# Patient Record
Sex: Female | Born: 1935 | Race: White | Hispanic: No | Smoking: Never smoker
Health system: Southern US, Community
[De-identification: ages and names within clinical notes are randomized; demographics above are authoritative.]

## PROBLEM LIST (undated history)

## (undated) DIAGNOSIS — C801 Malignant (primary) neoplasm, unspecified: Secondary | ICD-10-CM

## (undated) DIAGNOSIS — E079 Disorder of thyroid, unspecified: Secondary | ICD-10-CM

## (undated) DIAGNOSIS — I10 Essential (primary) hypertension: Secondary | ICD-10-CM

## (undated) DIAGNOSIS — K219 Gastro-esophageal reflux disease without esophagitis: Secondary | ICD-10-CM

## (undated) DIAGNOSIS — E7800 Pure hypercholesterolemia, unspecified: Secondary | ICD-10-CM

## (undated) HISTORY — PX: BACK SURGERY: SHX140

## (undated) HISTORY — PX: CHOLECYSTECTOMY: SHX55

## (undated) HISTORY — PX: NEPHRECTOMY: SHX65

## (undated) HISTORY — PX: CATARACT EXTRACTION: SUR2

## (undated) HISTORY — PX: NASAL SEPTUM SURGERY: SHX37

## (undated) HISTORY — PX: APPENDECTOMY: SHX54

## (undated) HISTORY — PX: HERNIA REPAIR: SHX51

## (undated) HISTORY — PX: TONSILLECTOMY: SUR1361

---

## 2010-04-17 ENCOUNTER — Inpatient Hospital Stay (HOSPITAL_COMMUNITY)
Admission: RE | Admit: 2010-04-17 | Discharge: 2010-04-19 | Disposition: A | Discharge status: Home / Self Care | Payer: Self-pay | Admitting: Neurosurgery

## 2010-10-05 LAB — CBC
HCT: 39.7 % (ref 36.0–46.0)
MCHC: 35 g/dL (ref 30.0–36.0)
MCV: 96.4 fL (ref 78.0–100.0)
Platelets: 300 10*3/uL (ref 150–400)
RDW: 12.5 % (ref 11.5–15.5)
WBC: 14.5 10*3/uL — ABNORMAL HIGH (ref 4.0–10.5)

## 2010-10-05 LAB — BASIC METABOLIC PANEL
BUN: 28 mg/dL — ABNORMAL HIGH (ref 6–23)
Chloride: 99 mEq/L (ref 96–112)
Creatinine, Ser: 0.94 mg/dL (ref 0.4–1.2)
Glucose, Bld: 122 mg/dL — ABNORMAL HIGH (ref 70–99)
Potassium: 4.3 mEq/L (ref 3.5–5.1)

## 2010-10-05 LAB — SURGICAL PCR SCREEN
MRSA, PCR: NEGATIVE
Staphylococcus aureus: NEGATIVE

## 2015-04-06 ENCOUNTER — Emergency Department (HOSPITAL_BASED_OUTPATIENT_CLINIC_OR_DEPARTMENT_OTHER): Payer: Medicare Other

## 2015-04-06 ENCOUNTER — Encounter (HOSPITAL_BASED_OUTPATIENT_CLINIC_OR_DEPARTMENT_OTHER): Payer: Self-pay

## 2015-04-06 ENCOUNTER — Emergency Department (HOSPITAL_BASED_OUTPATIENT_CLINIC_OR_DEPARTMENT_OTHER)
Admission: EM | Admit: 2015-04-06 | Discharge: 2015-04-06 | Disposition: A | Discharge status: Home / Self Care | Payer: Medicare Other | Attending: Emergency Medicine | Admitting: Emergency Medicine

## 2015-04-06 DIAGNOSIS — Y9289 Other specified places as the place of occurrence of the external cause: Secondary | ICD-10-CM | POA: Insufficient documentation

## 2015-04-06 DIAGNOSIS — M25552 Pain in left hip: Secondary | ICD-10-CM

## 2015-04-06 DIAGNOSIS — K219 Gastro-esophageal reflux disease without esophagitis: Secondary | ICD-10-CM | POA: Insufficient documentation

## 2015-04-06 DIAGNOSIS — S3992XA Unspecified injury of lower back, initial encounter: Secondary | ICD-10-CM | POA: Insufficient documentation

## 2015-04-06 DIAGNOSIS — Y9389 Activity, other specified: Secondary | ICD-10-CM | POA: Diagnosis not present

## 2015-04-06 DIAGNOSIS — E079 Disorder of thyroid, unspecified: Secondary | ICD-10-CM | POA: Diagnosis not present

## 2015-04-06 DIAGNOSIS — E780 Pure hypercholesterolemia: Secondary | ICD-10-CM | POA: Insufficient documentation

## 2015-04-06 DIAGNOSIS — Z79899 Other long term (current) drug therapy: Secondary | ICD-10-CM | POA: Diagnosis not present

## 2015-04-06 DIAGNOSIS — Z880 Allergy status to penicillin: Secondary | ICD-10-CM | POA: Diagnosis not present

## 2015-04-06 DIAGNOSIS — I10 Essential (primary) hypertension: Secondary | ICD-10-CM | POA: Diagnosis not present

## 2015-04-06 DIAGNOSIS — X58XXXA Exposure to other specified factors, initial encounter: Secondary | ICD-10-CM | POA: Insufficient documentation

## 2015-04-06 DIAGNOSIS — Y998 Other external cause status: Secondary | ICD-10-CM | POA: Insufficient documentation

## 2015-04-06 DIAGNOSIS — S79912A Unspecified injury of left hip, initial encounter: Secondary | ICD-10-CM | POA: Diagnosis not present

## 2015-04-06 HISTORY — DX: Disorder of thyroid, unspecified: E07.9

## 2015-04-06 HISTORY — DX: Gastro-esophageal reflux disease without esophagitis: K21.9

## 2015-04-06 HISTORY — DX: Pure hypercholesterolemia, unspecified: E78.00

## 2015-04-06 HISTORY — DX: Essential (primary) hypertension: I10

## 2015-04-06 NOTE — ED Provider Notes (Signed)
CSN: [id number]     Arrival date & time 04/06/15  1100 History   First MD Initiated Contact with Patient 04/06/15 1203     Chief Complaint  Patient presents with  . Hip Pain     (Consider location/radiation/quality/duration/timing/severity/associated sxs/prior Treatment) HPI Comments: Patient presents with complaint of left hip pain which started acutely yesterday when she twisted her left leg. Patient states that she was able to hold herself up and did not fall to the ground. She felt a pop when she twisted her leg. She has been able to ambulate but is using a cane that she had after previous back surgery. Typically she does not need a cane to ambulate. She denies new numbness or tingling in her left leg; sometimes she will have some tingling in her foot that she relates to her previous back problems. No treatments prior to arrival except for rest overnight. Patient came in for evaluation after her symptoms were not better today. Patient has some baseline back pain. Patient denies warning symptoms of back pain including: fecal incontinence, urinary retention or overflow incontinence, night sweats, waking from sleep with back pain, unexplained fevers or weight loss, h/o cancer, IVDU, recent trauma.     The history is provided by the patient.    Past Medical History  Diagnosis Date  . Thyroid disease   . Hypertension   . GERD (gastroesophageal reflux disease)   . High cholesterol    Past Surgical History  Procedure Laterality Date  . Back surgery    . Tonsillectomy    . Appendectomy    . Cholecystectomy    . Hernia repair    . Cataract extraction    . Nasal septum surgery     No family history on file. Social History  Substance Use Topics  . Smoking status: Never Smoker   . Smokeless tobacco: None  . Alcohol Use: No   OB History    No data available     Review of Systems  Constitutional: Positive for activity change.  Musculoskeletal: Positive for arthralgias and gait  problem. Negative for back pain, joint swelling and neck pain.  Skin: Negative for wound.  Neurological: Negative for weakness and numbness.      Allergies  Acrylic polymer; Ceclor; Cephalosporins; Metronidazole; Morphine and related; Other; Penicillins; Septra; and Tramadol  Home Medications   Prior to Admission medications   Medication Sig Start Date End Date Taking? Authorizing Provider  beclomethasone (QVAR) 80 MCG/ACT inhaler Inhale into the lungs 2 (two) times daily.   Yes Historical Provider, MD  labetalol (NORMODYNE) 200 MG tablet Take 200 mg by mouth 2 (two) times daily.   Yes Historical Provider, MD  levothyroxine (SYNTHROID, LEVOTHROID) 75 MCG tablet Take 75 mcg by mouth daily before breakfast.   Yes Historical Provider, MD  omeprazole (PRILOSEC) 40 MG capsule Take 40 mg by mouth daily.   Yes Historical Provider, MD  Pitavastatin Calcium (LIVALO) 2 MG TABS Take by mouth.   Yes Historical Provider, MD  ranitidine (ZANTAC) 300 MG capsule Take 300 mg by mouth every evening.   Yes Historical Provider, MD   BP 155/59 mmHg  Pulse 59  Temp(Src) 98.7 F (37.1 C) (Oral)  Resp 18  Ht 5\' 1"  (1.549 m)  Wt 125 lb (56.7 kg)  BMI 23.63 kg/m2  SpO2 99%   Physical Exam  Constitutional: She appears well-developed and well-nourished.  HENT:  Head: Normocephalic and atraumatic.  Eyes: Pupils are equal, round, and reactive to light.  Neck: Normal range of motion. Neck supple.  Cardiovascular: Exam reveals no decreased pulses.   Pulses:      Dorsalis pedis pulses are 2+ on the right side, and 2+ on the left side.       Posterior tibial pulses are 2+ on the right side, and 2+ on the left side.  Musculoskeletal: She exhibits tenderness. She exhibits no edema.       Left hip: She exhibits tenderness and bony tenderness. She exhibits normal range of motion and normal strength.       Left knee: Normal.       Left ankle: Normal.       Thoracic back: She exhibits normal range of motion, no  tenderness and no bony tenderness.       Lumbar back: She exhibits tenderness. She exhibits normal range of motion and no bony tenderness.       Left upper leg: Normal. She exhibits no tenderness, no bony tenderness, no swelling and no edema.       Left lower leg: She exhibits no tenderness, no bony tenderness, no swelling and no edema.       Legs:      Left foot: There is normal range of motion, no tenderness and no bony tenderness.  Neurological: She is alert. No sensory deficit.  Motor, sensation, and vascular distal to the injury is fully intact.   Skin: Skin is warm and dry.  Psychiatric: She has a normal mood and affect.  Nursing note and vitals reviewed.   ED Course  Procedures (including critical care time) Labs Review Labs Reviewed - No data to display  Imaging Review Dg Hip Unilat With Pelvis 2-3 Views Left  04/06/2015   CLINICAL DATA:  Twisted left hip yesterday with lateral pain. Initial encounter.  EXAM: DG HIP (WITH OR WITHOUT PELVIS) 2-3V LEFT  COMPARISON:  None.  FINDINGS: There is no evidence of hip fracture or dislocation.  Mild spurring and narrowing of the left and right hips.  Incidental enthesophyte to the greater trochanter on the left.  Osteopenia and atherosclerosis.  IMPRESSION: No acute finding.   Electronically Signed   By: Monte Fantasia M.D.   On: 04/06/2015 12:51   I have personally reviewed and evaluated these images and lab results as part of my medical decision-making.   EKG Interpretation None      Patient seen and examined. Work-up initiated.    Vital signs reviewed and are as follows: BP 155/59 mmHg  Pulse 59  Temp(Src) 98.7 F (37.1 C) (Oral)  Resp 18  Ht 5\' 1"  (1.549 m)  Wt 125 lb (56.7 kg)  BMI 23.63 kg/m2  SpO2 99%  Patient discussed with Dr. Alvino Chapel.   Imaging negative for fracture. Patient does have some bone spurs. Plan is for right protocol, low-dose NSAIDs/Tylenol (patient does not have a history of heart disease,  uncontrolled hypertension, peptic ulcer disease to preclude short course NSAIDs at low dose). She will continue to use cane for stability the current time. She has orthopedic follow-up as needed. Encourage this if symptoms are not improved in the next 5-7 days with conservative management. Patient encouraged to return with weakness, worsening pain, or other concerns. She verbalizes understanding and agrees with plan   MDM   Final diagnoses:  Hip pain, left   Patient with left hip pain after twisting injury yesterday. Imaging is negative. Likely musculoskeletal pain from twisting injury. She is ambulatory. Lower extremity is neurovascularly intact at her baseline. Do  not suspect acute need for orthopedic intervention today. Conservative measures as above with orthopedic follow-up as needed is reasonable. Patient seems reliable to return with worsening symptoms or other concerns.   Carlisle Cater, PA-C 04/06/15 [place], MD 04/07/15 1539

## 2015-04-06 NOTE — ED Notes (Signed)
Yesterday, pt felt a pop in left hip with a twisting motion-states she has been able to ambulate with a cane-presents to triage in w/c

## 2015-04-06 NOTE — Discharge Instructions (Signed)
Please read and follow all provided instructions.  Your diagnoses today include:  1. Hip pain, left    Tests performed today include:  An x-ray of the affected area - does NOT show any broken bones, shows bone spur on the outside of the hip  Vital signs. See below for your results today.   Medications prescribed:   None  Take any prescribed medications only as directed.  Home care instructions:   Follow any educational materials contained in this packet  Use your cane as needed for stability  Follow R.I.C.E. Protocol:  R - rest your injury   I  - use ice on injury without applying directly to skin  C - compress injury with bandage or splint  E - elevate the injury as much as possible  Follow-up instructions: Please follow-up with your primary care provider or your orthopedic physician (bone specialist) if you continue to have significant pain in 1 week. In this case you may have a more severe injury that requires further care.   Return instructions:   Please return if your toes or feet are numb or tingling, appear gray or blue, or you have severe pain (also elevate the leg and loosen splint or wrap if you were given one)  Please return to the Emergency Department if you experience worsening symptoms.   Please return if you have any other emergent concerns.  Additional Information:  Your vital signs today were: BP 155/59 mmHg   Pulse 59   Temp(Src) 98.7 F (37.1 C) (Oral)   Resp 18   Ht 5\' 1"  (1.549 m)   Wt 125 lb (56.7 kg)   BMI 23.63 kg/m2   SpO2 99% If your blood pressure (BP) was elevated above 135/85 this visit, please have this repeated by your doctor within one month. --------------

## 2015-10-05 DIAGNOSIS — E785 Hyperlipidemia, unspecified: Secondary | ICD-10-CM | POA: Diagnosis present

## 2015-10-05 DIAGNOSIS — K219 Gastro-esophageal reflux disease without esophagitis: Secondary | ICD-10-CM | POA: Insufficient documentation

## 2015-10-05 DIAGNOSIS — E039 Hypothyroidism, unspecified: Secondary | ICD-10-CM | POA: Diagnosis present

## 2015-10-05 DIAGNOSIS — I10 Essential (primary) hypertension: Secondary | ICD-10-CM | POA: Diagnosis present

## 2016-02-14 IMAGING — CR DG HIP (WITH OR WITHOUT PELVIS) 2-3V*L*
3 series · 3 of 3 positions shown · non-contrast
Comparison: None.

CLINICAL DATA: Twisted left hip yesterday with lateral pain.
Initial encounter.

EXAM:
DG HIP (WITH OR WITHOUT PELVIS) 2-3V LEFT

[t pelvis a.p.]
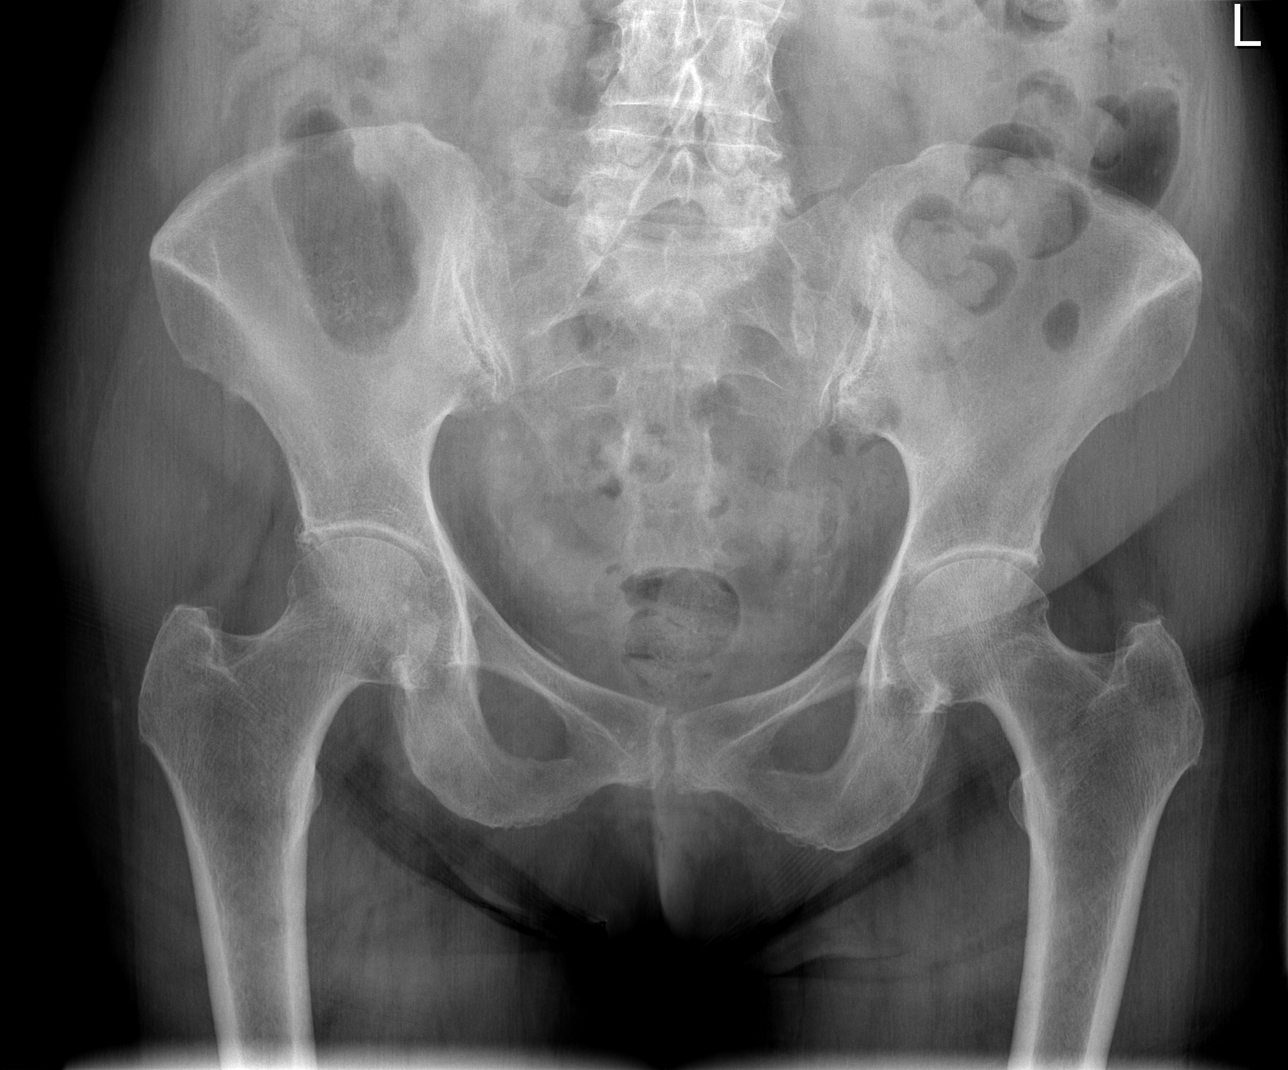

[t hip ap left]
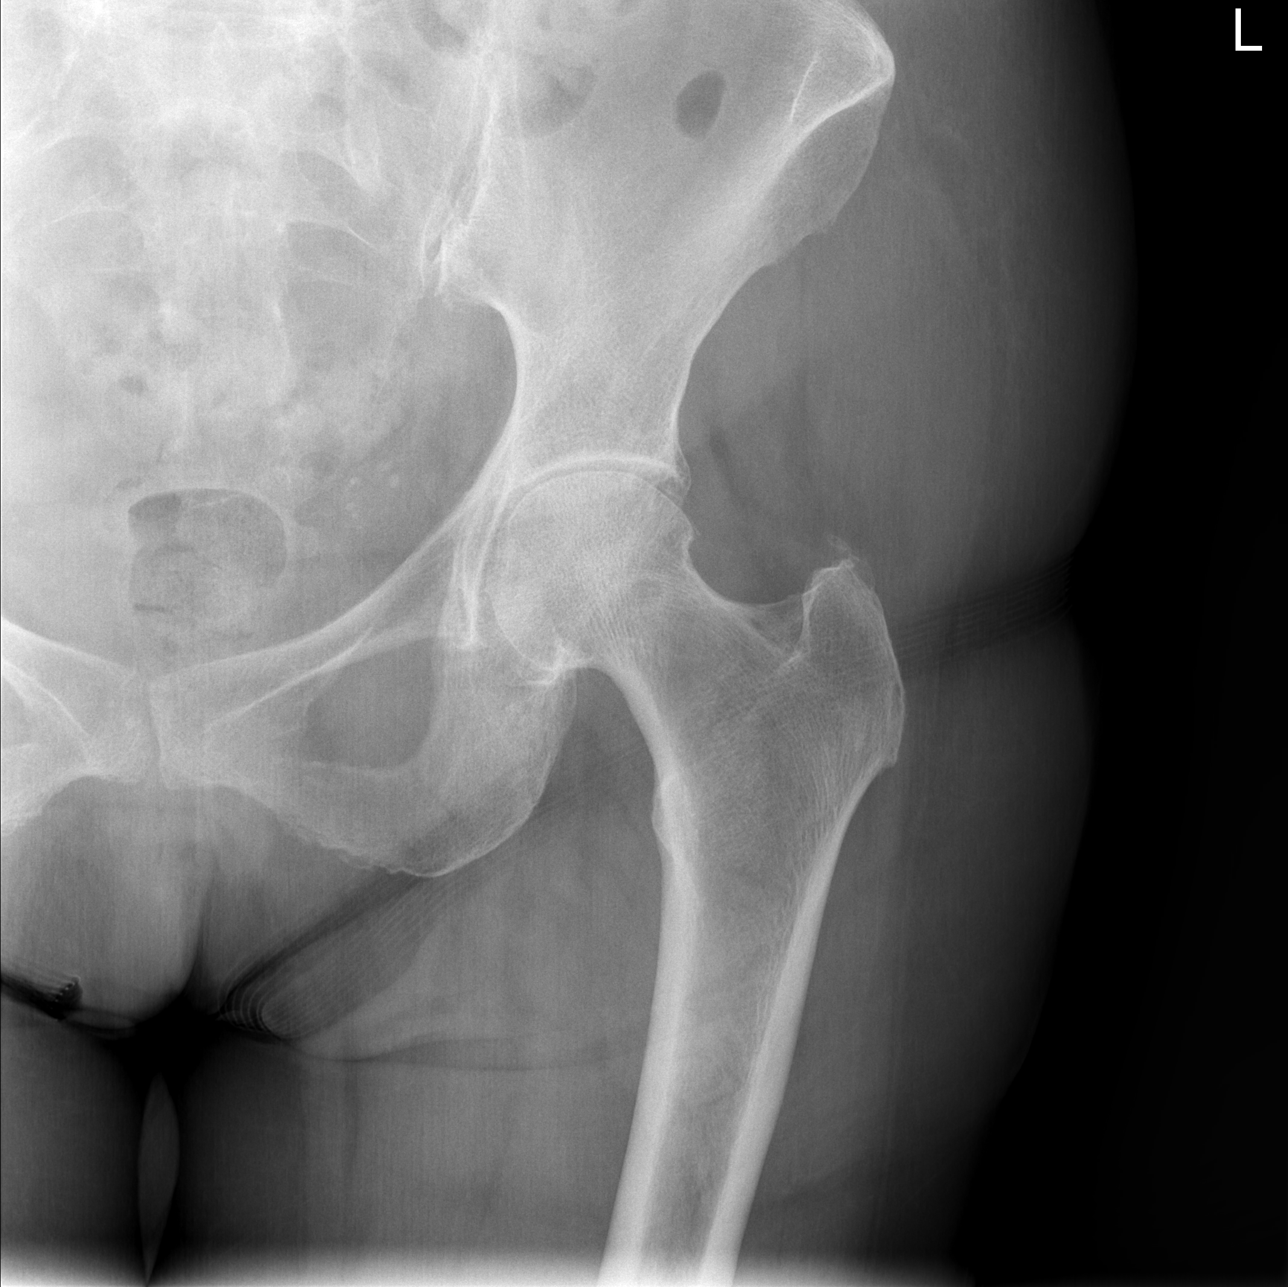

[t hip frog leg left]
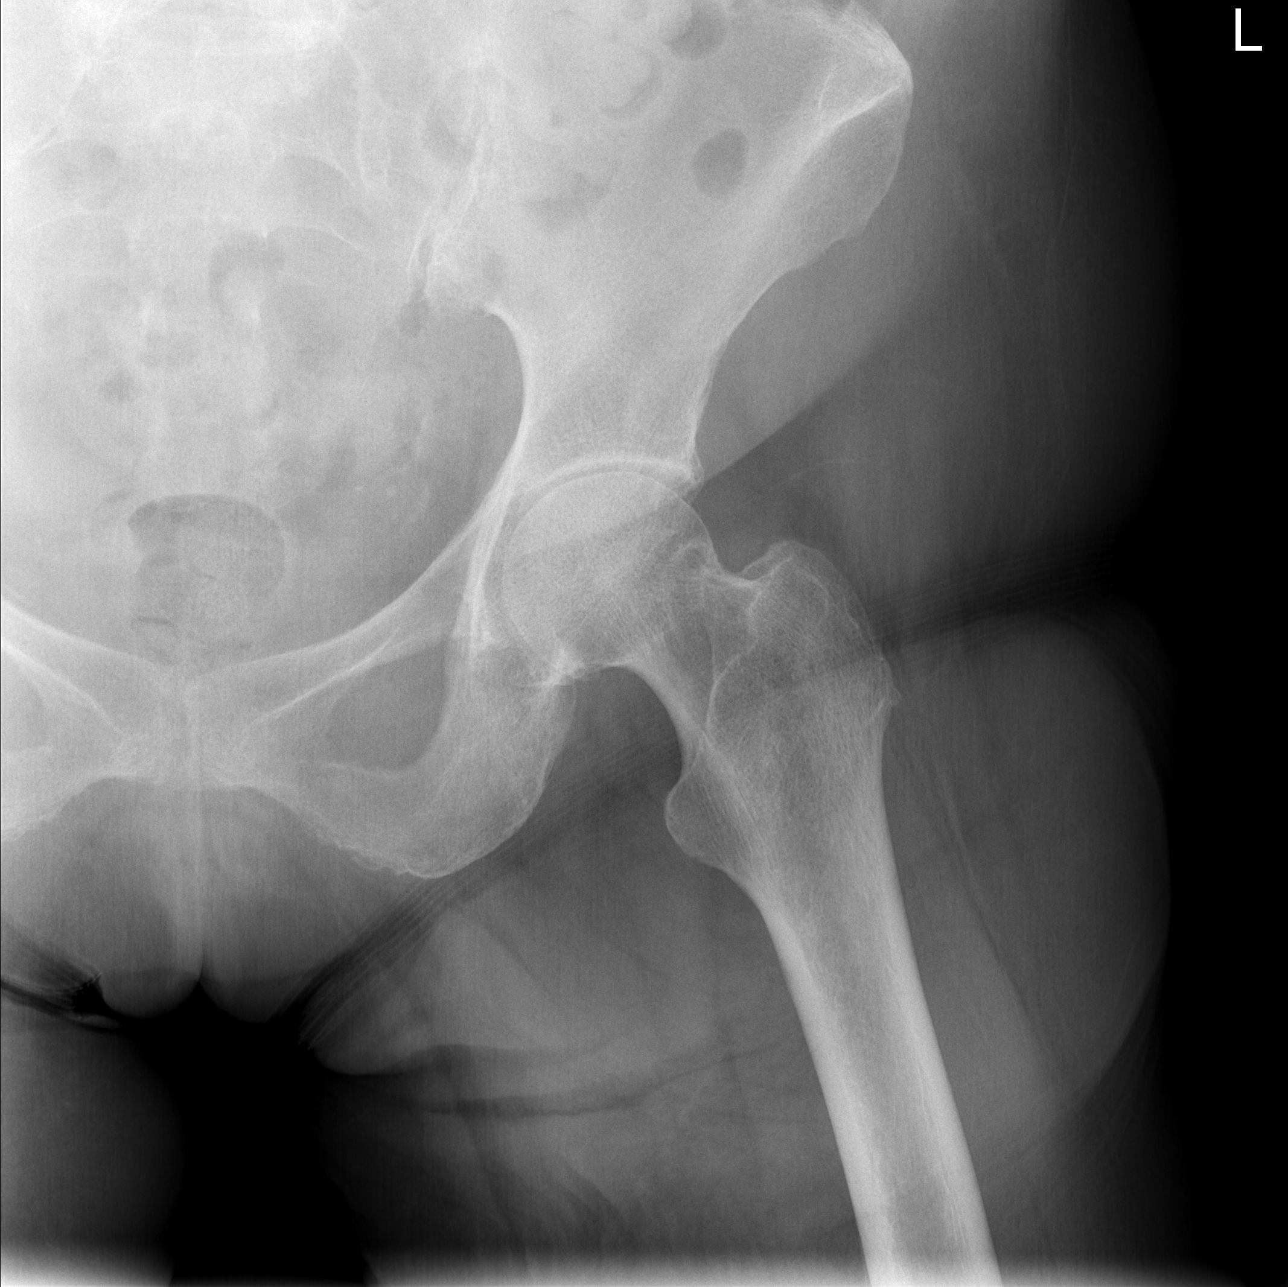

[3 of 3 positions shown; findings below may reference images not displayed]

FINDINGS: There is no evidence of hip fracture or dislocation.

Mild spurring and narrowing of the left and right hips.

Incidental enthesophyte to the greater trochanter on the left.

Osteopenia and atherosclerosis.
IMPRESSION: No acute finding.

## 2020-04-24 ENCOUNTER — Other Ambulatory Visit: Payer: Self-pay

## 2020-04-24 ENCOUNTER — Encounter (HOSPITAL_BASED_OUTPATIENT_CLINIC_OR_DEPARTMENT_OTHER): Payer: Self-pay | Admitting: *Deleted

## 2020-04-24 ENCOUNTER — Emergency Department (HOSPITAL_BASED_OUTPATIENT_CLINIC_OR_DEPARTMENT_OTHER)
Admission: EM | Admit: 2020-04-24 | Discharge: 2020-04-24 | Disposition: A | Discharge status: Home / Self Care | Payer: Medicare Other | Attending: Emergency Medicine | Admitting: Emergency Medicine

## 2020-04-24 DIAGNOSIS — U071 COVID-19: Secondary | ICD-10-CM | POA: Diagnosis not present

## 2020-04-24 DIAGNOSIS — Z85828 Personal history of other malignant neoplasm of skin: Secondary | ICD-10-CM | POA: Diagnosis not present

## 2020-04-24 DIAGNOSIS — R070 Pain in throat: Secondary | ICD-10-CM | POA: Diagnosis present

## 2020-04-24 DIAGNOSIS — I10 Essential (primary) hypertension: Secondary | ICD-10-CM | POA: Insufficient documentation

## 2020-04-24 HISTORY — DX: Malignant (primary) neoplasm, unspecified: C80.1

## 2020-04-24 LAB — RESPIRATORY PANEL BY RT PCR (FLU A&B, COVID)
Influenza A by PCR: NEGATIVE
Influenza B by PCR: NEGATIVE
SARS Coronavirus 2 by RT PCR: POSITIVE — AB

## 2020-04-24 LAB — GROUP A STREP BY PCR: Group A Strep by PCR: NOT DETECTED

## 2020-04-24 MED ORDER — DEXAMETHASONE 6 MG PO TABS
6.0000 mg | ORAL_TABLET | Freq: Once | ORAL | Status: AC
  Administered 2020-04-24: 6 mg via ORAL
  Filled 2020-04-24: qty 1

## 2020-04-24 NOTE — ED Notes (Signed)
States she rec the Covid vaccine in Jan/ Feb of this year

## 2020-04-24 NOTE — ED Notes (Signed)
Placed on cont POX monitoring with int NBP assessments

## 2020-04-24 NOTE — ED Provider Notes (Signed)
[place] EMERGENCY DEPARTMENT Provider Note   CSN: [id number] Arrival date & time: 04/24/20  1812     History Chief Complaint  Patient presents with  . Sore Throat    Gina Stewart is a 86 y.o. female with past medical history significant for hypertension, hyperlipidemia, GERD.  Had Covid vaccinations. Not anticoagulated.  HPI Patient presents to emergency department today with chief complaint of sore throat x3 days.  She is also endorsing generalized weakness.  She is describing her sore throat as an aching pain.  Pain is worse with swallowing.  She did not try any medications for symptoms prior to arrival. She is rating p.o. intake without any difficulty.  She has been checking her temperature twice daily and has been afebrile.  Denies any chills, congestion, shortness of breath, difficulty breathing, chest pain, abdominal pain, nausea, emesis, urinary symptoms, diarrhea, numbness, tingling, weakness.  No sick contacts or known Covid exposures.    Past Medical History:  Diagnosis Date  . Cancer ([place])   . GERD (gastroesophageal reflux disease)   . High cholesterol   . Hypertension   . Thyroid disease     There are no problems to display for this patient.   Past Surgical History:  Procedure Laterality Date  . APPENDECTOMY    . BACK SURGERY    . CATARACT EXTRACTION    . CHOLECYSTECTOMY    . HERNIA REPAIR    . NASAL SEPTUM SURGERY    . NEPHRECTOMY Right   . TONSILLECTOMY       OB History   No obstetric history on file.     No family history on file.  Social History   Tobacco Use  . Smoking status: Never Smoker  . Smokeless tobacco: Never Used  Substance Use Topics  . Alcohol use: No  . Drug use: No    Home Medications Prior to Admission medications   Medication Sig Start Date End Date Taking? Authorizing Provider  beclomethasone (QVAR) 80 MCG/ACT inhaler Inhale into the lungs 2 (two) times daily.    [provider]  labetalol  (NORMODYNE) 200 MG tablet Take 200 mg by mouth 2 (two) times daily.    [provider]  levothyroxine (SYNTHROID, LEVOTHROID) 75 MCG tablet Take 75 mcg by mouth daily before breakfast.    [provider]  omeprazole (PRILOSEC) 40 MG capsule Take 40 mg by mouth daily.    [provider]  Pitavastatin Calcium (LIVALO) 2 MG TABS Take by mouth.    [provider]  ranitidine (ZANTAC) 300 MG capsule Take 300 mg by mouth every evening.    [provider]    Allergies    Acrylic polymer [carbomer], Ceclor [cefaclor], Cephalosporins, Metronidazole, Morphine and related, Other, Penicillins, Septra [sulfamethoxazole-trimethoprim], and Tramadol  Review of Systems   Review of Systems All other systems are reviewed and are negative for acute change except as noted in the HPI.  Physical Exam Updated Vital Signs BP (!) 174/68 (BP Location: Right Arm)   Pulse 66   Temp 98.5 F (36.9 C) (Oral)   Resp 17   Ht 5\' 1"  (1.549 m)   Wt 50.8 kg   SpO2 98%   BMI 21.16 kg/m   Physical Exam Vitals and nursing note reviewed.  Constitutional:      General: She is not in acute distress.    Appearance: She is not ill-appearing.  HENT:     Head: Normocephalic and atraumatic.     Right Ear:  Tympanic membrane and external ear normal.     Left Ear: Tympanic membrane and external ear normal.     Nose: Nose normal.     Mouth/Throat:     Mouth: Mucous membranes are moist. No oral lesions.     Pharynx: Oropharynx is clear. Uvula midline. Posterior oropharyngeal erythema present. No oropharyngeal exudate or uvula swelling.  Eyes:     General: No scleral icterus.       Right eye: No discharge.        Left eye: No discharge.     Extraocular Movements: Extraocular movements intact.     Conjunctiva/sclera: Conjunctivae normal.     Pupils: Pupils are equal, round, and reactive to light.  Neck:     Vascular: No JVD.  Cardiovascular:     Rate and Rhythm: Normal rate and  regular rhythm.     Pulses: Normal pulses.          Radial pulses are 2+ on the right side and 2+ on the left side.     Heart sounds: Normal heart sounds.  Pulmonary:     Comments: Lungs clear to auscultation in all fields. Symmetric chest rise. No wheezing, rales, or rhonchi. Oxygen saturation 98 % on room air during exam. Abdominal:     Comments: Abdomen is soft, non-distended, and non-tender in all quadrants. No rigidity, no guarding. No peritoneal signs.  Musculoskeletal:        General: Normal range of motion.     Cervical back: Normal range of motion.  Skin:    General: Skin is warm and dry.     Capillary Refill: Capillary refill takes less than 2 seconds.  Neurological:     Mental Status: She is oriented to person, place, and time.     GCS: GCS eye subscore is 4. GCS verbal subscore is 5. GCS motor subscore is 6.     Comments: Fluent speech, no facial droop.  Psychiatric:        Behavior: Behavior normal.     ED Results / Procedures / Treatments   Labs (all labs ordered are listed, but only abnormal results are displayed) Labs Reviewed  RESPIRATORY PANEL BY RT PCR (FLU A&B, COVID) - Abnormal; Notable for the following components:      Result Value   SARS Coronavirus 2 by RT PCR POSITIVE (*)    All other components within normal limits  GROUP A STREP BY PCR    EKG None  Radiology No results found.  Procedures Procedures (including critical care time)  Medications Ordered in ED Medications  dexamethasone (DECADRON) tablet 6 mg (6 mg Oral Given 04/24/20 2216)    ED Course  I have reviewed the triage vital signs and the nursing notes.  Pertinent labs & imaging results that were available during my care of the patient were reviewed by me and considered in my medical decision making (see chart for details).    MDM Rules/Calculators/A&P                          History provided by patient with additional history obtained from chart review.    Symptoms and  exam most suggestive of uncomplicated viral illness. DDX incluldes viral URI/LRI, COVID-19.  No travel. No known exposures to confirmed COVID-19.    Patient is very well appearing. Exam is benign.  Normal WOB. No fever, tachypnea, tachycardia, hypoxemia. Lungs are CTAB. I do not think that a CXR is indicated at  this time as VS are WNL, there are no signs of consolidation on auscultation and there is no hypoxia, increased WOB or other concerning features to exam. No significant h/o immunocompromise. Doubt bacterial bronchitis or pneumonia.  Strep and covid tests collected in triage. Covid positive, strep negative. She has no clinical signs of severe illness, dehydration, to warrant further emergent work up in ER. Patient ambulated in the emergency department without respiratory distress or hypoxia, SpO2 >95% on room air.  Given reassuring physical exam, symptoms, will discharge with symptomatic treatment. Given dose of decadron here for sore throat. Recommend telemedicine PCP f/u in the next 2-3 days for persistent symptoms  for further guidance. Self-isolation instructions discussed.  The patient appears reasonably screened and/or stabilized for discharge and I doubt any other medical condition or other [facility] requiring further screening, evaluation, or treatment in the ED at this time prior to discharge. The patient is safe for discharge with strict return precautions discussed. Findings and plan of care discussed with supervising physician Dr. Alvino Chapel.    Gina Stewart was evaluated in Emergency Department on 04/24/2020 for the symptoms described in the history of present illness. She was evaluated in the context of the global COVID-19 pandemic, which necessitated consideration that the patient might be at risk for infection with the SARS-CoV-2 virus that causes COVID-19. Institutional protocols and algorithms that pertain to the evaluation of patients at risk for COVID-19 are in a state of rapid change  based on information released by regulatory bodies including the CDC and federal and state organizations. These policies and algorithms were followed during the patient's care in the ED.   Portions of this note were generated with [employer]. Dictation errors may occur despite best attempts at proofreading.   Final Clinical Impression(s) / ED Diagnoses Final diagnoses:  [id number]    Rx / DC Orders ED Discharge Orders    None       Flint Melter 04/24/20 2310    Davonna Belling, MD 04/24/20 2359

## 2020-04-24 NOTE — ED Triage Notes (Signed)
Pt reports sore throat x 3 days. Report slight cough. She is vaccinated for Covid but has not had booster, second dose was given in February. Reports fatigue. O2 sats 99% in triage

## 2020-04-24 NOTE — Discharge Instructions (Signed)
Thank you for allowing [place] to care for you today.   Please return to the emergency department if you have any new or worsening symptoms.  You tested positive for covid-19 today.   Medications- You can take medications to help treat your symptoms: -Tylenol for fever and body aches. Please take as prescribed on the bottle. -Over the coutner cough medicine such as mucinex, robitussin, or other brands. -Flonase or saline nasal spray for nasal congestion -Vitamins as recommended by CDC  Treatment- This is a virus and unfortunately there are no antibitotics approved to treat this virus at this time. It is important to monitor your symptoms closely: -You should have a theremometer at home to check your temperature when feeling feverish. -Use a pulse ox meter to measure your oxygen when feeling short of breath.  -If your fever is over 100.4 despite taking tylenol or if your oxygen level drops below 94% these are reasons to return to the emergency department for further evaluation. Please call the emergency department before you come to make [place] aware.    We recommend you self-isolate for 10 days. You can end your quarantine if you are fever free on day 10 without taking tylenol.  Again: symptoms of shortness of breath, chest pain, difficulty breathing, new onset of confusion, any symptoms that are concerning. If any of these symptoms you should come to emergency department for evaluation.   I hope you feel better soon

## 2020-04-24 NOTE — ED Notes (Signed)
Dr. Alvino Chapel and triage nurse aware that pt's covid is positive

## 2020-04-24 NOTE — ED Notes (Signed)
Began having throat irritation with non prod cough approx 4 days ago, also had sore throat, not feeling well, poor energy, appetite is poor as well. Strep Test done and is negative but has covid positive results.

## 2020-04-25 ENCOUNTER — Telehealth (HOSPITAL_COMMUNITY): Payer: Self-pay | Admitting: Adult Health

## 2020-04-25 ENCOUNTER — Other Ambulatory Visit (HOSPITAL_COMMUNITY): Payer: Self-pay | Admitting: Adult Health

## 2020-04-25 DIAGNOSIS — U071 COVID-19: Secondary | ICD-10-CM

## 2020-04-25 NOTE — Progress Notes (Signed)
I connected by phone with Gina Stewart on 04/25/2020 at 11:53 AM to discuss the potential use of a new treatment for mild to moderate COVID-19 viral infection in non-hospitalized patients.  This patient is a 86 y.o. female that meets the FDA criteria for Emergency Use Authorization of COVID monoclonal antibody casirivimab/imdevimab or bamlanivimab/eteseviamb.  Has a (+) direct SARS-CoV-2 viral test result  Has mild or moderate COVID-19   Is NOT hospitalized due to COVID-19  Is within 10 days of symptom onset  Has at least one of the high risk factor(s) for progression to severe COVID-19 and/or hospitalization as defined in EUA.  Specific high risk criteria : Older age (>/= 84 yo)   Sx onset 04/21/20   I have spoken and communicated the following to the patient or parent/caregiver regarding COVID monoclonal antibody treatment:  1. FDA has authorized the emergency use for the treatment of mild to moderate COVID-19 in adults and pediatric patients with positive results of direct SARS-CoV-2 viral testing who are 84 years of age and older weighing at least 40 kg, and who are at high risk for progressing to severe COVID-19 and/or hospitalization.  2. The significant known and potential risks and benefits of COVID monoclonal antibody, and the extent to which such potential risks and benefits are unknown.  3. Information on available alternative treatments and the risks and benefits of those alternatives, including clinical trials.  4. Patients treated with COVID monoclonal antibody should continue to self-isolate and use infection control measures (e.g., wear mask, isolate, social distance, avoid sharing personal items, clean and disinfect "high touch" surfaces, and frequent handwashing) according to CDC guidelines.   5. The patient or parent/caregiver has the option to accept or refuse COVID monoclonal antibody treatment.  After reviewing this information with the patient, the patient has  agreed to receive one of the available covid 19 monoclonal antibodies and will be provided an appropriate fact sheet prior to infusion. Scot Dock, NP 04/25/2020 11:53 AM

## 2020-04-25 NOTE — Telephone Encounter (Signed)
error 

## 2020-04-25 NOTE — Telephone Encounter (Signed)
Called and LMOM regarding monoclonal antibody treatment for COVID 19 given to those who are at risk for complications and/or hospitalization of the virus.  Patient meets criteria based on: her age  Call back number given: [phone]  My chart message: unable to send  Wilber Bihari, NP

## 2020-04-26 ENCOUNTER — Ambulatory Visit (HOSPITAL_COMMUNITY)
Admission: RE | Admit: 2020-04-26 | Discharge: 2020-04-26 | Disposition: A | Discharge status: Home / Self Care | Payer: Medicare Other | Source: Ambulatory Visit | Attending: Pulmonary Disease | Admitting: Pulmonary Disease

## 2020-04-26 DIAGNOSIS — Z23 Encounter for immunization: Secondary | ICD-10-CM | POA: Insufficient documentation

## 2020-04-26 DIAGNOSIS — U071 COVID-19: Secondary | ICD-10-CM | POA: Diagnosis present

## 2020-04-26 MED ORDER — SODIUM CHLORIDE 0.9 % IV SOLN: INTRAVENOUS | Status: DC | PRN

## 2020-04-26 MED ORDER — ALBUTEROL SULFATE HFA 108 (90 BASE) MCG/ACT IN AERS: 2.0000 | INHALATION_SPRAY | Freq: Once | RESPIRATORY_TRACT | Status: DC | PRN

## 2020-04-26 MED ORDER — METHYLPREDNISOLONE SODIUM SUCC 125 MG IJ SOLR: 125.0000 mg | Freq: Once | INTRAMUSCULAR | Status: DC | PRN

## 2020-04-26 MED ORDER — DIPHENHYDRAMINE HCL 50 MG/ML IJ SOLN: 50.0000 mg | Freq: Once | INTRAMUSCULAR | Status: DC | PRN

## 2020-04-26 MED ORDER — SODIUM CHLORIDE 0.9 % IV SOLN
1200.0000 mg | Freq: Once | INTRAVENOUS | Status: AC
  Administered 2020-04-26: 1200 mg via INTRAVENOUS

## 2020-04-26 MED ORDER — EPINEPHRINE 0.3 MG/0.3ML IJ SOAJ: 0.3000 mg | Freq: Once | INTRAMUSCULAR | Status: DC | PRN

## 2020-04-26 MED ORDER — FAMOTIDINE IN NACL 20-0.9 MG/50ML-% IV SOLN: 20.0000 mg | Freq: Once | INTRAVENOUS | Status: DC | PRN

## 2020-04-26 NOTE — Progress Notes (Signed)
  Diagnosis: COVID-19  Physician: Dr Joya Gaskins  Procedure: [facility] Infusion [facility] Med: casirivimab\imdevimab infusion - Provided patient with casirivimab\imdevimab fact sheet for patients, parents and caregivers prior to infusion.  Complications: No immediate complications noted.    Discharge: Discharged home   Gina Stewart 04/26/2020

## 2020-04-26 NOTE — Discharge Instructions (Signed)

## 2022-01-27 ENCOUNTER — Other Ambulatory Visit: Payer: Self-pay

## 2022-01-27 ENCOUNTER — Emergency Department (HOSPITAL_BASED_OUTPATIENT_CLINIC_OR_DEPARTMENT_OTHER)
Admission: EM | Admit: 2022-01-27 | Discharge: 2022-01-27 | Disposition: A | Discharge status: Home / Self Care | Payer: Medicare (Managed Care) | Attending: Emergency Medicine | Admitting: Emergency Medicine

## 2022-01-27 ENCOUNTER — Encounter (HOSPITAL_BASED_OUTPATIENT_CLINIC_OR_DEPARTMENT_OTHER): Payer: Self-pay | Admitting: Emergency Medicine

## 2022-01-27 ENCOUNTER — Emergency Department (HOSPITAL_BASED_OUTPATIENT_CLINIC_OR_DEPARTMENT_OTHER): Payer: Medicare (Managed Care)

## 2022-01-27 DIAGNOSIS — Z79899 Other long term (current) drug therapy: Secondary | ICD-10-CM | POA: Diagnosis not present

## 2022-01-27 DIAGNOSIS — U071 COVID-19: Secondary | ICD-10-CM | POA: Insufficient documentation

## 2022-01-27 DIAGNOSIS — I10 Essential (primary) hypertension: Secondary | ICD-10-CM | POA: Diagnosis not present

## 2022-01-27 DIAGNOSIS — R5383 Other fatigue: Secondary | ICD-10-CM | POA: Diagnosis present

## 2022-01-27 LAB — CBC WITH DIFFERENTIAL/PLATELET
Abs Immature Granulocytes: 0.04 10*3/uL (ref 0.00–0.07)
Basophils Absolute: 0 10*3/uL (ref 0.0–0.1)
Basophils Relative: 0 %
Eosinophils Absolute: 0.1 10*3/uL (ref 0.0–0.5)
Eosinophils Relative: 1 %
HCT: 33 % — ABNORMAL LOW (ref 36.0–46.0)
Hemoglobin: 11.4 g/dL — ABNORMAL LOW (ref 12.0–15.0)
Immature Granulocytes: 0 %
Lymphocytes Relative: 8 %
Lymphs Abs: 0.9 10*3/uL (ref 0.7–4.0)
MCH: 33.2 pg (ref 26.0–34.0)
MCHC: 34.5 g/dL (ref 30.0–36.0)
MCV: 96.2 fL (ref 80.0–100.0)
Monocytes Absolute: 1.1 10*3/uL — ABNORMAL HIGH (ref 0.1–1.0)
Monocytes Relative: 11 %
Neutro Abs: 8.1 10*3/uL — ABNORMAL HIGH (ref 1.7–7.7)
Neutrophils Relative %: 80 %
Platelets: 256 10*3/uL (ref 150–400)
RBC: 3.43 MIL/uL — ABNORMAL LOW (ref 3.87–5.11)
RDW: 12.8 % (ref 11.5–15.5)
WBC: 10.2 10*3/uL (ref 4.0–10.5)
nRBC: 0 % (ref 0.0–0.2)

## 2022-01-27 LAB — COMPREHENSIVE METABOLIC PANEL
ALT: 21 U/L (ref 0–44)
AST: 18 U/L (ref 15–41)
Albumin: 3.5 g/dL (ref 3.5–5.0)
Alkaline Phosphatase: 46 U/L (ref 38–126)
Anion gap: 6 (ref 5–15)
BUN: 30 mg/dL — ABNORMAL HIGH (ref 8–23)
CO2: 24 mmol/L (ref 22–32)
Calcium: 8.5 mg/dL — ABNORMAL LOW (ref 8.9–10.3)
Chloride: 105 mmol/L (ref 98–111)
Creatinine, Ser: 0.82 mg/dL (ref 0.44–1.00)
GFR, Estimated: >60 mL/min (ref >60)
Glucose, Bld: 110 mg/dL — ABNORMAL HIGH (ref 70–99)
Potassium: 4 mmol/L (ref 3.5–5.1)
Sodium: 135 mmol/L (ref 135–145)
Total Bilirubin: 0.6 mg/dL (ref 0.3–1.2)
Total Protein: 6.1 g/dL — ABNORMAL LOW (ref 6.5–8.1)

## 2022-01-27 LAB — SARS CORONAVIRUS 2 BY RT PCR: SARS Coronavirus 2 by RT PCR: POSITIVE — AB

## 2022-01-27 MED ORDER — NIRMATRELVIR/RITONAVIR (PAXLOVID)TABLET: 3.0000 | ORAL_TABLET | Freq: Two times a day (BID) | ORAL | 0 refills | Status: AC

## 2022-01-27 MED ORDER — NIRMATRELVIR/RITONAVIR (PAXLOVID)TABLET: 3.0000 | ORAL_TABLET | Freq: Two times a day (BID) | ORAL | 0 refills | Status: DC

## 2022-01-27 MED ORDER — SODIUM CHLORIDE 0.9 % IV BOLUS
1000.0000 mL | Freq: Once | INTRAVENOUS | Status: AC
  Administered 2022-01-27: 1000 mL via INTRAVENOUS

## 2022-01-27 NOTE — ED Provider Notes (Signed)
[facility]ERGENC[facility] DEPARTMENT Provider Note   CSN: [id number] Arrival date & time: 01/27/22  1829     History  Chief Complaint  Patient presents with   Fatigue    Gina Stewart is a 88 y.o. female history of hypertension, recurrent sinusitis, here presenting with fatigue and cough.  Patient was on a cruise to [place] recently.  She just came back yesterday.  She states that it was a long flight and she did not get much sleep.  She woke up this morning with a sore throat and worsening cough.  Also some congestion as well.  Patient states that she just finished a course of clindamycin yesterday.  She states that her ENT doctor prescribed it before her cruise.  Denies any fevers.  The history is provided by the patient.       Home Medications Prior to Admission medications   Medication Sig Start Date End Date Taking? Authorizing Provider  beclomethasone (QVAR) 80 MCG/ACT inhaler Inhale into the lungs 2 (two) times daily.    [provider]  labetalol (NORMODYNE) 200 MG tablet Take 200 mg by mouth 2 (two) times daily.    [provider]  levothyroxine (SYNTHROID, LEVOTHROID) 75 MCG tablet Take 75 mcg by mouth daily before breakfast.    [provider]  nirmatrelvir/ritonavir EUA (PAXLOVID) 20 x 150 MG & 10 x '100MG'$  TABS Take 3 tablets by mouth 2 (two) times daily for 5 days. Patient GFR is > 60. Take nirmatrelvir (150 mg) two tablets twice daily for 5 days and ritonavir (100 mg) one tablet twice daily for 5 days. 01/27/22 02/01/22 Yes Drenda Freeze, MD  omeprazole (PRILOSEC) 40 MG capsule Take 40 mg by mouth daily.    [provider]  Pitavastatin Calcium (LIVALO) 2 MG TABS Take by mouth.    [provider]  ranitidine (ZANTAC) 300 MG capsule Take 300 mg by mouth every evening.    [provider]      Allergies    Acrylic polymer [carbomer], Ceclor [cefaclor], Cephalosporins, Metronidazole, Morphine and related,  Other, Penicillins, Septra [sulfamethoxazole-trimethoprim], and Tramadol    Review of Systems   Review of Systems  HENT:  Positive for congestion and sneezing.   Respiratory:  Positive for cough.   All other systems reviewed and are negative.   Physical Exam Updated Vital Signs BP (!) 148/58   Pulse 67   Temp 98.5 F (36.9 C) (Oral)   Resp 17   Ht 5' (1.524 m)   Wt 49.9 kg   SpO2 98%   BMI 21.48 kg/m  Physical Exam Vitals and nursing note reviewed.  Constitutional:      Comments: Well-appearing for age  HENT:     Head: Normocephalic.     Nose: Nose normal.     Mouth/Throat:     Mouth: Mucous membranes are dry.     Comments: Posterior pharynx slightly dry but not erythematous Eyes:     Extraocular Movements: Extraocular movements intact.     Pupils: Pupils are equal, round, and reactive to light.  Cardiovascular:     Rate and Rhythm: Normal rate and regular rhythm.     Pulses: Normal pulses.     Heart sounds: Normal heart sounds.  Pulmonary:     Effort: Pulmonary effort is normal.     Breath sounds: Normal breath sounds.  Abdominal:     General: Abdomen is flat.     Palpations: Abdomen is soft.  Musculoskeletal:  General: Normal range of motion.     Cervical back: Normal range of motion and neck supple.  Skin:    General: Skin is warm.     Capillary Refill: Capillary refill takes less than 2 seconds.  Neurological:     General: No focal deficit present.     Mental Status: She is oriented to person, place, and time.  Psychiatric:        Mood and Affect: Mood normal.        Behavior: Behavior normal.     ED Results / Procedures / Treatments   Labs (all labs ordered are listed, but only abnormal results are displayed) Labs Reviewed  SARS CORONAVIRUS 2 BY RT PCR - Abnormal; Notable for the following components:      Result Value   SARS Coronavirus 2 by RT PCR POSITIVE (*)    All other components within normal limits  CBC WITH DIFFERENTIAL/PLATELET -  Abnormal; Notable for the following components:   RBC 3.43 (*)    Hemoglobin 11.4 (*)    HCT 33.0 (*)    Neutro Abs 8.1 (*)    Monocytes Absolute 1.1 (*)    All other components within normal limits  COMPREHENSIVE METABOLIC PANEL - Abnormal; Notable for the following components:   Glucose, Bld 110 (*)    BUN 30 (*)    Calcium 8.5 (*)    Total Protein 6.1 (*)    All other components within normal limits    EKG EKG Interpretation  Date/Time:  Saturday January 27 2022 18:53:49 EDT Ventricular Rate:  71 PR Interval:  128 QRS Duration: 70 QT Interval:  382 QTC Calculation: 415 R Axis:   60 Text Interpretation: Normal sinus rhythm Normal ECG No previous ECGs available Confirmed by Wandra Arthurs [id number]) on 01/27/2022 6:57:34 PM  Radiology DG Chest 2 View  Result Date: 01/27/2022 CLINICAL DATA:  Cough.  Skip chest x-ray 01/02/2022 EXAM: CHEST - 2 VIEW COMPARISON:  None Available. FINDINGS: The heart size and mediastinal contours are within normal limits. Both lungs are clear. The visualized skeletal structures are unremarkable. There surgical clips in the upper abdomen. IMPRESSION: No active cardiopulmonary disease. Electronically Signed   By: Ronney Asters M.D.   On: 01/27/2022 20:14    Procedures Procedures    Medications Ordered in ED Medications  sodium chloride 0.9 % bolus 1,000 mL (1,000 mLs Intravenous New Bag/Given 01/27/22 1940)    ED Course/ Medical Decision Making/ A&P                           Medical Decision Making Gina Stewart is a 88 y.o. female here presenting with cough and congestion.  Patient just came back from a cruise.  Concern for possible COVID versus URI.  Also consider pneumonia as well.  Plan to get CBC and CMP and chest x-ray.  Will hydrate patient and reassess  8:17 PM I reviewed patient's labs and imaging study.  Patient's COVID test is positive.  White blood cell count is normal and chest x-ray is clear.  Patient qualifies for Paxlovid and is stable  for discharge.  Problems Addressed: COVID-19: acute illness or injury  Amount and/or Complexity of Data Reviewed Labs: ordered. Decision-making details documented in ED Course. Radiology: ordered and independent interpretation performed. Decision-making details documented in ED Course. ECG/medicine tests: ordered and independent interpretation performed. Decision-making details documented in ED Course.    Final Clinical Impression(s) / ED Diagnoses Final  diagnoses:  COVID-19    Rx / DC Orders ED Discharge Orders          Ordered    nirmatrelvir/ritonavir EUA (PAXLOVID) 20 x 150 MG & 10 x '100MG'$  TABS  2 times daily        01/27/22 2009              Drenda Freeze, MD 01/27/22 2018

## 2022-01-27 NOTE — ED Triage Notes (Signed)
Pt arrives pov, steady gait, c/o cough and  congestion for a "few months", and fatigue today. Reports returning from cruise yesterday. Reports just finished taking abx for sinus infection today. VAN neg. AOx4

## 2022-01-27 NOTE — Discharge Instructions (Addendum)
You have COVID.  Please take Paxlovid as prescribed.  You need to isolate for 5 days and then wear masks around others for another 5 days  See your doctor for follow-up  Return to ER if you have worse cough, fever, trouble breathing

## 2022-03-04 ENCOUNTER — Emergency Department (HOSPITAL_BASED_OUTPATIENT_CLINIC_OR_DEPARTMENT_OTHER)
Admission: EM | Admit: 2022-03-04 | Discharge: 2022-03-04 | Disposition: A | Discharge status: Home / Self Care | Payer: Medicare (Managed Care) | Attending: Emergency Medicine | Admitting: Emergency Medicine

## 2022-03-04 ENCOUNTER — Other Ambulatory Visit: Payer: Self-pay

## 2022-03-04 ENCOUNTER — Encounter (HOSPITAL_BASED_OUTPATIENT_CLINIC_OR_DEPARTMENT_OTHER): Payer: Self-pay | Admitting: Emergency Medicine

## 2022-03-04 DIAGNOSIS — I951 Orthostatic hypotension: Secondary | ICD-10-CM | POA: Diagnosis not present

## 2022-03-04 DIAGNOSIS — I10 Essential (primary) hypertension: Secondary | ICD-10-CM | POA: Diagnosis not present

## 2022-03-04 DIAGNOSIS — Z79899 Other long term (current) drug therapy: Secondary | ICD-10-CM | POA: Insufficient documentation

## 2022-03-04 DIAGNOSIS — R42 Dizziness and giddiness: Secondary | ICD-10-CM | POA: Diagnosis present

## 2022-03-04 LAB — BASIC METABOLIC PANEL
Anion gap: 7 (ref 5–15)
BUN: 20 mg/dL (ref 8–23)
CO2: 27 mmol/L (ref 22–32)
Calcium: 8.7 mg/dL — ABNORMAL LOW (ref 8.9–10.3)
Chloride: 103 mmol/L (ref 98–111)
Creatinine, Ser: 0.81 mg/dL (ref 0.44–1.00)
GFR, Estimated: >60 mL/min (ref >60)
Glucose, Bld: 99 mg/dL (ref 70–99)
Potassium: 4.2 mmol/L (ref 3.5–5.1)
Sodium: 137 mmol/L (ref 135–145)

## 2022-03-04 LAB — CBC WITH DIFFERENTIAL/PLATELET
Abs Immature Granulocytes: 0.15 10*3/uL — ABNORMAL HIGH (ref 0.00–0.07)
Basophils Absolute: 0.1 10*3/uL (ref 0.0–0.1)
Basophils Relative: 1 %
Eosinophils Absolute: 0.1 10*3/uL (ref 0.0–0.5)
Eosinophils Relative: 1 %
HCT: 38.7 % (ref 36.0–46.0)
Hemoglobin: 13 g/dL (ref 12.0–15.0)
Immature Granulocytes: 1 %
Lymphocytes Relative: 18 %
Lymphs Abs: 2 10*3/uL (ref 0.7–4.0)
MCH: 32.7 pg (ref 26.0–34.0)
MCHC: 33.6 g/dL (ref 30.0–36.0)
MCV: 97.5 fL (ref 80.0–100.0)
Monocytes Absolute: 1 10*3/uL (ref 0.1–1.0)
Monocytes Relative: 9 %
Neutro Abs: 7.5 10*3/uL (ref 1.7–7.7)
Neutrophils Relative %: 70 %
Platelets: 319 10*3/uL (ref 150–400)
RBC: 3.97 MIL/uL (ref 3.87–5.11)
RDW: 13.4 % (ref 11.5–15.5)
WBC: 10.7 10*3/uL — ABNORMAL HIGH (ref 4.0–10.5)
nRBC: 0 % (ref 0.0–0.2)

## 2022-03-04 MED ORDER — LACTATED RINGERS IV BOLUS
1000.0000 mL | Freq: Once | INTRAVENOUS | Status: AC
Start: 2022-03-04 — End: 2022-03-04
  Administered 2022-03-04: 1000 mL via INTRAVENOUS

## 2022-03-04 NOTE — ED Notes (Signed)
Pt laid flat at 2:43 for orthostatic vitals

## 2022-03-04 NOTE — Discharge Instructions (Signed)
You were evaluated in the emergency department for dizziness. I suspect your dizziness is due to dehydration. Your sodium levels were normal. Please resume your regular eating habits and drink to satisfy your thirst. Follow up with your primary doctor as soon as possible after you leave the hospital.

## 2022-03-04 NOTE — ED Provider Notes (Signed)
[facility]GENCY DEPARTMENT Provider Note   CSN: [id number] Arrival date & time: 03/04/22  1256     History  Chief Complaint  Patient presents with   Weakness    Gina Stewart is a 88 y.o. female with history of hypertension hyperlipidemia who presents with a chief complaint of dizziness and generalized weakness ongoing for several weeks.  Started around the time she was diagnosed and admitted for COVID infection and hyponatremia.  She does not feel like she is returned to her baseline since being discharged from the hospital.  She describes lightheadedness upon standing, and the feeling as though she is going to faint.  She denies world-spinning sensation.  She reports that she was instructed to restrict fluid intake, but feels like that causes her to become very dehydrated.  She was also instructed to discontinue HCTZ.  She acknowledges that she probably does not eat as much as she should.  Review of systems is negative for chest pain, shortness of breath, cough, abdominal pain, nausea, vomiting, diarrhea.  Medical history: Hypertension Hyperlipidemia Hyponatremia   Weakness      Home Medications Prior to Admission medications   Medication Sig Start Date End Date Taking? Authorizing Provider  beclomethasone (QVAR) 80 MCG/ACT inhaler Inhale into the lungs 2 (two) times daily.    [provider]  labetalol (NORMODYNE) 200 MG tablet Take 200 mg by mouth 2 (two) times daily.    [provider]  levothyroxine (SYNTHROID, LEVOTHROID) 75 MCG tablet Take 75 mcg by mouth daily before breakfast.    [provider]  omeprazole (PRILOSEC) 40 MG capsule Take 40 mg by mouth daily.    [provider]  Pitavastatin Calcium (LIVALO) 2 MG TABS Take by mouth.    [provider]  ranitidine (ZANTAC) 300 MG capsule Take 300 mg by mouth every evening.    [provider]      Allergies    Acrylic polymer [carbomer], Ceclor  [cefaclor], Cephalosporins, Metronidazole, Morphine and related, Other, Penicillins, Septra [sulfamethoxazole-trimethoprim], and Tramadol    Review of Systems   Review of Systems  Neurological:  Positive for weakness.  All other systems reviewed and are negative.   Physical Exam Updated Vital Signs BP (!) 151/91 (BP Location: Left Arm)   Pulse 97   Temp 98.1 F (36.7 C) (Oral)   Resp 16   SpO2 98%  Physical Exam Vitals and nursing note reviewed.  Constitutional:      General: She is not in acute distress.    Appearance: She is well-developed.  HENT:     Head: Normocephalic and atraumatic.  Eyes:     Conjunctiva/sclera: Conjunctivae normal.  Cardiovascular:     Rate and Rhythm: Normal rate and regular rhythm.     Heart sounds: No murmur heard. Pulmonary:     Effort: Pulmonary effort is normal. No respiratory distress.     Breath sounds: Normal breath sounds.  Abdominal:     Palpations: Abdomen is soft.     Tenderness: There is no abdominal tenderness.  Musculoskeletal:        General: No swelling.     Cervical back: Neck supple.  Skin:    General: Skin is warm and dry.     Capillary Refill: Capillary refill takes less than 2 seconds.  Neurological:     General: No focal deficit present.     Mental Status: She is alert.  Psychiatric:        Mood and Affect: Mood  normal.     ED Results / Procedures / Treatments   Labs (all labs ordered are listed, but only abnormal results are displayed) Labs Reviewed  BASIC METABOLIC PANEL - Abnormal; Notable for the following components:      Result Value   Calcium 8.7 (*)    All other components within normal limits  CBC WITH DIFFERENTIAL/PLATELET - Abnormal; Notable for the following components:   WBC 10.7 (*)    Abs Immature Granulocytes 0.15 (*)    All other components within normal limits    EKG EKG Interpretation  Date/Time:  Sunday March 04 2022 14:20:19 EDT Ventricular Rate:  77 PR Interval:  136 QRS  Duration: 85 QT Interval:  392 QTC Calculation: 444 R Axis:   17 Text Interpretation: Sinus rhythm Low voltage, precordial leads Probable anteroseptal infarct, old No significant change since last tracing Confirmed by Blanchie Dessert [phone]) on 03/04/2022 2:28:29 PM  Radiology No results found.  Procedures Procedures    Medications Ordered in ED Medications  lactated ringers bolus 1,000 mL (1,000 mLs Intravenous New Bag/Given 03/04/22 1421)    ED Course/ Medical Decision Making/ A&P                           Medical Decision Making Amount and/or Complexity of Data Reviewed Labs: ordered.   Gina Stewart is a 88 year old female with hypertension and hyperlipidemia who presents with several weeks of generalized weakness and presyncope.  She was recently discharged from the hospital after treatment for COVID-19 and hyponatremia.  She has been struggling with the fluid restriction.  She eats a relatively limited diet.  On exam she is well-appearing and euvolemic.  Differential diagnosis includes presyncope due to hypovolemia and orthostatic hypotension, hyponatremia.  Less concern for cardiac cause like dysrhythmia.  No localizing signs to suggest infection.   Co morbidities that complicate the patient evaluation  Hypertension Hyperlipidemia  Additional history obtained:  Additional history and/or information obtained from friend at bedside External records from outside source obtained and reviewed including charts from prior hospital encounters   Lab Tests:  I Ordered (or co-signed), and personally interpreted labs.  The pertinent results include: CBC, BMP, which were within normal limits.  Cardiac Monitoring:  The patient was maintained on a cardiac monitor.  The cardiac monitored showed an rhythm of NSR. The patient was also maintained on pulse oximetry. The readings were typically within normal limits.   Medicines ordered and prescription drug management:  I  ordered medication including LR 1000 cc IV Reevaluation of the patient after these medicines showed that the patient improved  Reevaluation:  After the interventions noted above, I reevaluated the patient and found that they have :improved.   Dispostion:  After consideration of the diagnostic results and the patients response to treatment, I feel that the patent would benefit from discharge home to resume a regular diet, drink to thirst, and follow up with PCP.          Final Clinical Impression(s) / ED Diagnoses Final diagnoses:  Orthostatic hypotension    Rx / DC Orders ED Discharge Orders     None         Nani Gasser, MD 03/04/22 1524    Blanchie Dessert, MD 03/07/22 1537

## 2022-03-04 NOTE — ED Notes (Signed)
Patient states that she feel dizziness  when she stand  alert x4

## 2022-03-04 NOTE — ED Triage Notes (Signed)
Pt reports she was recently hospitalized for low sodium. Has tried to keep hydrated at home, but feels the same as she did before with lightheadedness and generally not feeling well. No n/v/d.

## 2022-03-18 ENCOUNTER — Emergency Department (HOSPITAL_BASED_OUTPATIENT_CLINIC_OR_DEPARTMENT_OTHER): Payer: Medicare (Managed Care)

## 2022-03-18 ENCOUNTER — Other Ambulatory Visit: Payer: Self-pay

## 2022-03-18 ENCOUNTER — Emergency Department (HOSPITAL_BASED_OUTPATIENT_CLINIC_OR_DEPARTMENT_OTHER)
Admission: EM | Admit: 2022-03-18 | Discharge: 2022-03-18 | Disposition: A | Discharge status: Home / Self Care | Payer: Medicare (Managed Care) | Attending: Emergency Medicine | Admitting: Emergency Medicine

## 2022-03-18 ENCOUNTER — Encounter (HOSPITAL_BASED_OUTPATIENT_CLINIC_OR_DEPARTMENT_OTHER): Payer: Self-pay | Admitting: Emergency Medicine

## 2022-03-18 DIAGNOSIS — R42 Dizziness and giddiness: Secondary | ICD-10-CM | POA: Diagnosis not present

## 2022-03-18 DIAGNOSIS — E860 Dehydration: Secondary | ICD-10-CM | POA: Insufficient documentation

## 2022-03-18 LAB — COMPREHENSIVE METABOLIC PANEL
ALT: 20 U/L (ref 0–44)
AST: 20 U/L (ref 15–41)
Albumin: 3.8 g/dL (ref 3.5–5.0)
Alkaline Phosphatase: 57 U/L (ref 38–126)
Anion gap: 10 (ref 5–15)
BUN: 25 mg/dL — ABNORMAL HIGH (ref 8–23)
CO2: 24 mmol/L (ref 22–32)
Calcium: 8.9 mg/dL (ref 8.9–10.3)
Chloride: 101 mmol/L (ref 98–111)
Creatinine, Ser: 0.93 mg/dL (ref 0.44–1.00)
GFR, Estimated: 58 mL/min — ABNORMAL LOW (ref >60)
Glucose, Bld: 145 mg/dL — ABNORMAL HIGH (ref 70–99)
Potassium: 4.7 mmol/L (ref 3.5–5.1)
Sodium: 135 mmol/L (ref 135–145)
Total Bilirubin: 0.5 mg/dL (ref 0.3–1.2)
Total Protein: 6.8 g/dL (ref 6.5–8.1)

## 2022-03-18 LAB — CBC WITH DIFFERENTIAL/PLATELET
Abs Immature Granulocytes: 0.05 10*3/uL (ref 0.00–0.07)
Basophils Absolute: 0 10*3/uL (ref 0.0–0.1)
Basophils Relative: 1 %
Eosinophils Absolute: 0 10*3/uL (ref 0.0–0.5)
Eosinophils Relative: 0 %
HCT: 38.1 % (ref 36.0–46.0)
Hemoglobin: 13 g/dL (ref 12.0–15.0)
Immature Granulocytes: 1 %
Lymphocytes Relative: 9 %
Lymphs Abs: 0.8 10*3/uL (ref 0.7–4.0)
MCH: 33 pg (ref 26.0–34.0)
MCHC: 34.1 g/dL (ref 30.0–36.0)
MCV: 96.7 fL (ref 80.0–100.0)
Monocytes Absolute: 0.4 10*3/uL (ref 0.1–1.0)
Monocytes Relative: 6 %
Neutro Abs: 6.7 10*3/uL (ref 1.7–7.7)
Neutrophils Relative %: 83 %
Platelets: 332 10*3/uL (ref 150–400)
RBC: 3.94 MIL/uL (ref 3.87–5.11)
RDW: 13.2 % (ref 11.5–15.5)
WBC: 8 10*3/uL (ref 4.0–10.5)
nRBC: 0 % (ref 0.0–0.2)

## 2022-03-18 MED ORDER — MECLIZINE HCL 25 MG PO TABS
25.0000 mg | ORAL_TABLET | Freq: Once | ORAL | Status: AC
Start: 2022-03-18 — End: 2022-03-18
  Administered 2022-03-18: 25 mg via ORAL
  Filled 2022-03-18: qty 1

## 2022-03-18 MED ORDER — SODIUM CHLORIDE 0.9 % IV BOLUS
1000.0000 mL | Freq: Once | INTRAVENOUS | Status: AC
  Administered 2022-03-18: 1000 mL via INTRAVENOUS

## 2022-03-18 NOTE — ED Provider Notes (Signed)
[facility] DEPARTMENT Provider Note   CSN: [id number] Arrival date & time: 03/18/22  1235     History  Chief Complaint  Patient presents with   Dizziness    Gina Stewart is a 88 y.o. female.  34 y oF with a chief complaints of feeling dizzy.  She has had trouble with this off and on over the past couple months or so.  She has been seen in the ED multiple times for this.  She was told that her sodium was low and she thinks this feels consistent with the same.  Describes it as a rotary like movement.  Seems to be worse when she tries to stand up and walk.  She feels like she has been eating and drinking normally.  She recently had seen an ENT for chronic sinus congestion and was started on new therapies.  She was doing well yesterday and then woke up this morning and felt worse.  She denies trauma to the head of the neck.   Dizziness      Home Medications Prior to Admission medications   Medication Sig Start Date End Date Taking? Authorizing Provider  beclomethasone (QVAR) 80 MCG/ACT inhaler Inhale into the lungs 2 (two) times daily.    [provider]  labetalol (NORMODYNE) 200 MG tablet Take 200 mg by mouth 2 (two) times daily.    [provider]  levothyroxine (SYNTHROID, LEVOTHROID) 75 MCG tablet Take 75 mcg by mouth daily before breakfast.    [provider]  omeprazole (PRILOSEC) 40 MG capsule Take 40 mg by mouth daily.    [provider]  Pitavastatin Calcium (LIVALO) 2 MG TABS Take by mouth.    [provider]  ranitidine (ZANTAC) 300 MG capsule Take 300 mg by mouth every evening.    [provider]      Allergies    Acrylic polymer [carbomer], Ceclor [cefaclor], Cephalosporins, Metronidazole, Morphine and related, Other, Penicillins, Septra [sulfamethoxazole-trimethoprim], and Tramadol    Review of Systems   Review of Systems  Neurological:  Positive for dizziness.    Physical  Exam Updated Vital Signs BP (!) 184/81   Pulse 88   Temp 97.9 F (36.6 C) (Oral)   Resp 19   Wt 48.5 kg   SpO2 99%   BMI 20.90 kg/m  Physical Exam Vitals and nursing note reviewed.  Constitutional:      General: She is not in acute distress.    Appearance: She is well-developed. She is not diaphoretic.  HENT:     Head: Normocephalic and atraumatic.  Eyes:     Pupils: Pupils are equal, round, and reactive to light.  Cardiovascular:     Rate and Rhythm: Normal rate and regular rhythm.     Heart sounds: No murmur heard.    No friction rub. No gallop.  Pulmonary:     Effort: Pulmonary effort is normal.     Breath sounds: No wheezing or rales.  Abdominal:     General: There is no distension.     Palpations: Abdomen is soft.     Tenderness: There is no abdominal tenderness.  Musculoskeletal:        General: No tenderness.     Cervical back: Normal range of motion and neck supple.  Skin:    General: Skin is warm and dry.  Neurological:     Mental Status: She is alert and oriented to person, place, and time.  Psychiatric:  Behavior: Behavior normal.     ED Results / Procedures / Treatments   Labs (all labs ordered are listed, but only abnormal results are displayed) Labs Reviewed  COMPREHENSIVE METABOLIC PANEL - Abnormal; Notable for the following components:      Result Value   Glucose, Bld 145 (*)    BUN 25 (*)    GFR, Estimated 58 (*)    All other components within normal limits  CBC WITH DIFFERENTIAL/PLATELET    EKG EKG Interpretation  Date/Time:  Sunday March 18 2022 12:54:00 EDT Ventricular Rate:  83 PR Interval:  131 QRS Duration: 73 QT Interval:  356 QTC Calculation: 419 R Axis:   41 Text Interpretation: Sinus rhythm No significant change since last tracing Confirmed by Gina Stewart [phone]) on 03/18/2022 12:57:01 PM  Radiology CT Head Wo Contrast  Result Date: 03/18/2022 CLINICAL DATA:  Neuro deficit, acute.  Dizziness. EXAM: CT HEAD WITHOUT  CONTRAST TECHNIQUE: Contiguous axial images were obtained from the base of the skull through the vertex without intravenous contrast. RADIATION DOSE REDUCTION: This exam was performed according to the departmental dose-optimization program which includes automated exposure control, adjustment of the mA and/or kV according to patient size and/or use of iterative reconstruction technique. COMPARISON:  Head CT 02/05/2020 FINDINGS: Brain: Scattered low-density in the white matter is suggestive for chronic changes. No evidence for acute hemorrhage, mass lesion, midline shift, hydrocephalus or large infarct. Vascular: No hyperdense vessel or unexpected calcification. Skull: Normal. Negative for fracture or focal lesion. Sinuses/Orbits: Mucosal thickening and opacification in the ethmoid air cells. Other: None. IMPRESSION: 1. No acute intracranial abnormality. 2. Low-density in the white matter is suggestive for chronic small vessel ischemic changes. 3. Ethmoid sinus disease. Electronically Signed   By: Markus Daft M.D.   On: 03/18/2022 13:35   DG Chest Port 1 View  Result Date: 03/18/2022 CLINICAL DATA:  Cough. EXAM: PORTABLE CHEST 1 VIEW COMPARISON:  Chest radiograph 02/10/2022 FINDINGS: Both lungs are clear. No airspace disease or pulmonary edema. Heart and mediastinum are within normal limits. Atherosclerotic calcifications at the aortic arch. Negative for a pneumothorax. No acute bone abnormality. Scattered calcifications most likely associated with costal calcifications. IMPRESSION: No active disease. Electronically Signed   By: Markus Daft M.D.   On: 03/18/2022 13:30    Procedures Procedures    Medications Ordered in ED Medications  sodium chloride 0.9 % bolus 1,000 mL (1,000 mLs Intravenous New Bag/Given 03/18/22 1356)  meclizine (ANTIVERT) tablet 25 mg (25 mg Oral Given 03/18/22 1353)    ED Course/ Medical Decision Making/ A&P                           Medical Decision Making Amount and/or  Complexity of Data Reviewed Labs: ordered. Radiology: ordered.   88 yo F with a chief complaints of dizziness.  This has been going on for months.  Seems to come and go.  She was told to prior evaluation for this that her sodium was low and has had chronic sinusitis.  She recently saw ENT for this and was started on some new medications.  We will obtain a laboratory evaluation bolus of IV fluids CT of the head.  Patient is coughing pretty consistently in the room we will obtain a chest x-ray.  Chest x-ray independently turbid by me without focal infiltrate.  CT of the head without obvious intracranial pathology.  No anemia no significant electrolyte abnormality.  Patient is feeling a bit  better after IV fluids.  Question dehydration.  We will have her follow-up with her family doctor in the office.  2:59 PM:  I have discussed the diagnosis/risks/treatment options with the patient and family.  Evaluation and diagnostic testing in the emergency department does not suggest an emergent condition requiring admission or immediate intervention beyond what has been performed at this time.  They will follow up with  PCP. We also discussed returning to the ED immediately if new or worsening sx occur. We discussed the sx which are most concerning (e.g., sudden worsening pain, fever, inability to tolerate by mouth) that necessitate immediate return. Medications administered to the patient during their visit and any new prescriptions provided to the patient are listed below.  Medications given during this visit Medications  sodium chloride 0.9 % bolus 1,000 mL (1,000 mLs Intravenous New Bag/Given 03/18/22 1356)  meclizine (ANTIVERT) tablet 25 mg (25 mg Oral Given 03/18/22 1353)     The patient appears reasonably screen and/or stabilized for discharge and I doubt any other medical condition or other [facility] requiring further screening, evaluation, or treatment in the ED at this time prior to discharge.           Final Clinical Impression(s) / ED Diagnoses Final diagnoses:  Dehydration    Rx / DC Orders ED Discharge Orders     None         Gina Etienne, DO 03/18/22 1459

## 2022-03-18 NOTE — ED Notes (Signed)
Urine send to lab

## 2022-03-18 NOTE — ED Notes (Signed)
D/c paperwork reviewed with pt.  All questions addressed prior to d/c. Pt ambulatory without assistance to ED exit, NAD.

## 2022-03-18 NOTE — ED Triage Notes (Signed)
Pt arrives pov, to triage in wheelchair, c/o dizziness today that started at 0630. Last normal last night 2130. Speech clear, recent admit for low Na+.. Pt denies HA. AOx 4, reports LUE distal tingling that has resolved

## 2022-03-18 NOTE — Discharge Instructions (Signed)
Eat and drink as well as you can for the next 48 hours.  Please follow-up with your family doctor in the office. 

## 2022-03-18 NOTE — ED Notes (Signed)
Patient transported to CT 

## 2022-03-22 ENCOUNTER — Emergency Department (HOSPITAL_BASED_OUTPATIENT_CLINIC_OR_DEPARTMENT_OTHER)
Admission: EM | Admit: 2022-03-22 | Discharge: 2022-03-22 | Discharge status: Against Medical Advice | Payer: Medicare (Managed Care)

## 2022-03-25 ENCOUNTER — Observation Stay (HOSPITAL_BASED_OUTPATIENT_CLINIC_OR_DEPARTMENT_OTHER)
Admission: EM | Admit: 2022-03-25 | Discharge: 2022-03-26 | Disposition: A | Discharge status: Home / Self Care | Payer: Medicare (Managed Care) | Attending: Internal Medicine | Admitting: Internal Medicine

## 2022-03-25 ENCOUNTER — Encounter (HOSPITAL_COMMUNITY): Payer: Self-pay

## 2022-03-25 ENCOUNTER — Other Ambulatory Visit: Payer: Self-pay

## 2022-03-25 ENCOUNTER — Emergency Department (HOSPITAL_BASED_OUTPATIENT_CLINIC_OR_DEPARTMENT_OTHER): Payer: Medicare (Managed Care)

## 2022-03-25 ENCOUNTER — Encounter (HOSPITAL_BASED_OUTPATIENT_CLINIC_OR_DEPARTMENT_OTHER): Payer: Self-pay

## 2022-03-25 DIAGNOSIS — R202 Paresthesia of skin: Secondary | ICD-10-CM

## 2022-03-25 DIAGNOSIS — N1831 Chronic kidney disease, stage 3a: Secondary | ICD-10-CM | POA: Insufficient documentation

## 2022-03-25 DIAGNOSIS — R200 Anesthesia of skin: Principal | ICD-10-CM

## 2022-03-25 DIAGNOSIS — Z859 Personal history of malignant neoplasm, unspecified: Secondary | ICD-10-CM | POA: Diagnosis not present

## 2022-03-25 DIAGNOSIS — E039 Hypothyroidism, unspecified: Secondary | ICD-10-CM | POA: Diagnosis not present

## 2022-03-25 DIAGNOSIS — Z79899 Other long term (current) drug therapy: Secondary | ICD-10-CM | POA: Insufficient documentation

## 2022-03-25 DIAGNOSIS — G459 Transient cerebral ischemic attack, unspecified: Secondary | ICD-10-CM

## 2022-03-25 DIAGNOSIS — Z7902 Long term (current) use of antithrombotics/antiplatelets: Secondary | ICD-10-CM | POA: Insufficient documentation

## 2022-03-25 DIAGNOSIS — I129 Hypertensive chronic kidney disease with stage 1 through stage 4 chronic kidney disease, or unspecified chronic kidney disease: Secondary | ICD-10-CM | POA: Insufficient documentation

## 2022-03-25 DIAGNOSIS — I10 Essential (primary) hypertension: Secondary | ICD-10-CM | POA: Diagnosis present

## 2022-03-25 DIAGNOSIS — E785 Hyperlipidemia, unspecified: Secondary | ICD-10-CM

## 2022-03-25 DIAGNOSIS — Z7982 Long term (current) use of aspirin: Secondary | ICD-10-CM | POA: Insufficient documentation

## 2022-03-25 LAB — CBC WITH DIFFERENTIAL/PLATELET
Abs Immature Granulocytes: 0.1 10*3/uL — ABNORMAL HIGH (ref 0.00–0.07)
Basophils Absolute: 0.1 10*3/uL (ref 0.0–0.1)
Basophils Relative: 1 %
Eosinophils Absolute: 0.3 10*3/uL (ref 0.0–0.5)
Eosinophils Relative: 2 %
HCT: 39.8 % (ref 36.0–46.0)
Hemoglobin: 13.7 g/dL (ref 12.0–15.0)
Immature Granulocytes: 1 %
Lymphocytes Relative: 17 %
Lymphs Abs: 2.1 10*3/uL (ref 0.7–4.0)
MCH: 33 pg (ref 26.0–34.0)
MCHC: 34.4 g/dL (ref 30.0–36.0)
MCV: 95.9 fL (ref 80.0–100.0)
Monocytes Absolute: 1.3 10*3/uL — ABNORMAL HIGH (ref 0.1–1.0)
Monocytes Relative: 11 %
Neutro Abs: 8.5 10*3/uL — ABNORMAL HIGH (ref 1.7–7.7)
Neutrophils Relative %: 68 %
Platelets: 365 10*3/uL (ref 150–400)
RBC: 4.15 MIL/uL (ref 3.87–5.11)
RDW: 13 % (ref 11.5–15.5)
WBC: 12.3 10*3/uL — ABNORMAL HIGH (ref 4.0–10.5)
nRBC: 0 % (ref 0.0–0.2)

## 2022-03-25 LAB — COMPREHENSIVE METABOLIC PANEL WITH GFR
ALT: 20 U/L (ref 0–44)
AST: 20 U/L (ref 15–41)
Albumin: 4 g/dL (ref 3.5–5.0)
Alkaline Phosphatase: 58 U/L (ref 38–126)
Anion gap: 9 (ref 5–15)
BUN: 23 mg/dL (ref 8–23)
CO2: 28 mmol/L (ref 22–32)
Calcium: 8.9 mg/dL (ref 8.9–10.3)
Chloride: 101 mmol/L (ref 98–111)
Creatinine, Ser: 0.89 mg/dL (ref 0.44–1.00)
GFR, Estimated: >60 mL/min
Glucose, Bld: 107 mg/dL — ABNORMAL HIGH (ref 70–99)
Potassium: 4 mmol/L (ref 3.5–5.1)
Sodium: 138 mmol/L (ref 135–145)
Total Bilirubin: 0.8 mg/dL (ref 0.3–1.2)
Total Protein: 6.7 g/dL (ref 6.5–8.1)

## 2022-03-25 LAB — TROPONIN I (HIGH SENSITIVITY)
Troponin I (High Sensitivity): 9 ng/L
Troponin I (High Sensitivity): 8 ng/L (ref <18)

## 2022-03-25 LAB — MAGNESIUM: Magnesium: 2.2 mg/dL (ref 1.7–2.4)

## 2022-03-25 MED ORDER — CLOPIDOGREL BISULFATE 75 MG PO TABS
75.0000 mg | ORAL_TABLET | Freq: Every day | ORAL | Status: DC
  Administered 2022-03-25 – 2022-03-26 (×2): 75 mg via ORAL
  Filled 2022-03-25 (×2): qty 1

## 2022-03-25 MED ORDER — ACETAMINOPHEN 160 MG/5ML PO SOLN: 650.0000 mg | ORAL | Status: DC | PRN

## 2022-03-25 MED ORDER — LORAZEPAM 0.5 MG PO TABS
0.5000 mg | ORAL_TABLET | Freq: Once | ORAL | Status: AC | PRN
  Administered 2022-03-26: 0.5 mg via ORAL
  Filled 2022-03-25: qty 1

## 2022-03-25 MED ORDER — ACETAMINOPHEN 650 MG RE SUPP: 650.0000 mg | RECTAL | Status: DC | PRN

## 2022-03-25 MED ORDER — ASPIRIN 81 MG PO CHEW
81.0000 mg | CHEWABLE_TABLET | Freq: Every day | ORAL | Status: DC
Start: 2022-03-25 — End: 2022-03-26
  Administered 2022-03-25 – 2022-03-26 (×2): 81 mg via ORAL
  Filled 2022-03-25 (×2): qty 1

## 2022-03-25 MED ORDER — LEVOTHYROXINE SODIUM 75 MCG PO TABS
75.0000 ug | ORAL_TABLET | Freq: Every day | ORAL | Status: DC
  Administered 2022-03-26: 75 ug via ORAL
  Filled 2022-03-25: qty 1

## 2022-03-25 MED ORDER — ONDANSETRON HCL 4 MG/2ML IJ SOLN
4.0000 mg | Freq: Four times a day (QID) | INTRAMUSCULAR | Status: DC | PRN
Start: 2022-03-25 — End: 2022-03-26

## 2022-03-25 MED ORDER — ENOXAPARIN SODIUM 30 MG/0.3ML IJ SOSY
30.0000 mg | PREFILLED_SYRINGE | INTRAMUSCULAR | Status: DC
  Administered 2022-03-25: 30 mg via SUBCUTANEOUS
  Filled 2022-03-25: qty 0.3

## 2022-03-25 MED ORDER — ACETAMINOPHEN 325 MG PO TABS: 650.0000 mg | ORAL_TABLET | ORAL | Status: DC | PRN

## 2022-03-25 MED ORDER — IOHEXOL 350 MG/ML SOLN
75.0000 mL | Freq: Once | INTRAVENOUS | Status: AC | PRN
  Administered 2022-03-25: 75 mL via INTRAVENOUS

## 2022-03-25 MED ORDER — SENNOSIDES-DOCUSATE SODIUM 8.6-50 MG PO TABS: 1.0000 | ORAL_TABLET | Freq: Every evening | ORAL | Status: DC | PRN

## 2022-03-25 MED ORDER — STROKE: EARLY STAGES OF RECOVERY BOOK
Freq: Once | Status: AC
Start: 2022-03-26 — End: 2022-03-26
  Filled 2022-03-25: qty 1

## 2022-03-25 MED ORDER — EZETIMIBE 10 MG PO TABS
10.0000 mg | ORAL_TABLET | Freq: Every day | ORAL | Status: DC
  Administered 2022-03-26: 10 mg via ORAL
  Filled 2022-03-25: qty 1

## 2022-03-25 NOTE — ED Notes (Signed)
Pt awaiting transfer, no needs or complaints at this time.

## 2022-03-25 NOTE — Progress Notes (Signed)
Plan of Care Note for accepted transfer   Patient: Gina Stewart MRN: [id number]   DOA: 03/25/2022  Facility requesting transfer: [facility] Requesting Provider: Dr. Laverta Baltimore  Reason for transfer: TIA r/o  Facility course: 88 year old with hx of GERD, HTN, HLD, hypothyroid, CKD stage 3a who presented to ED with complaints of dizziness. She has hx of vertigo, but woke up at 12:30 and didn't feel good. Felt lightheaded and just stayed in bed.  This AM she got up and felt like her walking was off. She then felt numbness in her left face that lasted less than an hour and resolved. She has no other focal deficits/weakness. Neurologically intact, tingling/numbness intact.   Vitals: stable, bp: 167/74 Labs: wbc: 12.3,  CT head: no acute finding CTA head/neck: pending In ED: neurology consulted, given ASA/plavix. Recommended TIA w/u and HTN control. Let neurology know when she gets here.   Plan of care: The patient is accepted for admission to Telemetry unit, at [facility]..    Author: Orma Flaming, MD 03/25/2022  Check [email or web address] for on-call coverage.  Nursing staff, Please call [place] number on Amion as soon as patient's arrival, so appropriate admitting provider can evaluate the pt.

## 2022-03-25 NOTE — ED Notes (Signed)
ED TO INPATIENT HANDOFF REPORT  ED Nurse Name and Phone #: Baxter Flattery, RN  S Name/Age/Gender Gina Stewart 88 y.o. female Room/Bed: MH06/MH06  Code Status   Code Status: Not on file  Home/SNF/Other Home Patient oriented to: self, place, time, and situation Is this baseline? Yes   Triage Complete: Triage complete  Chief Complaint Transient ischemic attack (TIA) [G45.9]  Triage Note Pt c/o dizziness that has been going on for some time. States she feels like she is going "to faint". Also having tingling on her left face and numbness and in left hand that started around 10am. It has gone away currently.   Allergies Allergies  Allergen Reactions   Acrylic Polymer [Carbomer]    Ceclor [Cefaclor]    Cephalosporins    Metronidazole    Morphine And Related    Other     Rubber   Penicillins    Septra [Sulfamethoxazole-Trimethoprim]    Tramadol     Level of Care/Admitting Diagnosis ED Disposition     ED Disposition  Admit   Condition  --   Comment  Hospital Area: [place] [[id number]]  Level of Care: Telemetry Medical [104]  Interfacility transfer: Yes  May place patient in observation at [facility] or [place] if equivalent level of care is available:: No  Covid Evaluation: Asymptomatic - no recent exposure (last 10 days) testing not required  Diagnosis: Transient ischemic attack (TIA) [706237]  Admitting Physician: Orma Flaming [[id number]]  Attending Physician: Orma Flaming [[id number]]          B Medical/Surgery History Past Medical History:  Diagnosis Date   Cancer ([place])    GERD (gastroesophageal reflux disease)    High cholesterol    Hypertension    Thyroid disease    Past Surgical History:  Procedure Laterality Date   APPENDECTOMY     BACK SURGERY     CATARACT EXTRACTION     CHOLECYSTECTOMY     HERNIA REPAIR     NASAL SEPTUM SURGERY     NEPHRECTOMY Right    TONSILLECTOMY       A IV Location/Drains/Wounds Patient  Lines/Drains/Airways Status     Active Line/Drains/Airways     Name Placement date Placement time Site Days   Peripheral IV 03/25/22 20 G Left Antecubital 03/25/22  1259  Antecubital  less than 1            Intake/Output Last 24 hours No intake or output data in the 24 hours ending 03/25/22 1701  Labs/Imaging Results for orders placed or performed during the hospital encounter of 03/25/22 (from the past 48 hour(s))  Comprehensive metabolic panel     Status: Abnormal   Collection Time: 03/25/22  1:00 PM  Result Value Ref Range   Sodium 138 135 - 145 mmol/L   Potassium 4.0 3.5 - 5.1 mmol/L   Chloride 101 98 - 111 mmol/L   CO2 28 22 - 32 mmol/L   Glucose, Bld 107 (H) 70 - 99 mg/dL    Comment: Glucose reference range applies only to samples taken after fasting for at least 8 hours.   BUN 23 8 - 23 mg/dL   Creatinine, Ser 0.89 0.44 - 1.00 mg/dL   Calcium 8.9 8.9 - 10.3 mg/dL   Total Protein 6.7 6.5 - 8.1 g/dL   Albumin 4.0 3.5 - 5.0 g/dL   AST 20 15 - 41 U/L   ALT 20 0 - 44 U/L   Alkaline Phosphatase 58 38 - 126 U/L  Total Bilirubin 0.8 0.3 - 1.2 mg/dL   GFR, Estimated >60 >60 mL/min    Comment: (NOTE) Calculated using the CKD-EPI Creatinine Equation (2021)    Anion gap 9 5 - 15    Comment: Performed at [facility], [address] [place], [place] [address]  CBC with Differential     Status: Abnormal   Collection Time: 03/25/22  1:00 PM  Result Value Ref Range   WBC 12.3 (H) 4.0 - 10.5 K/uL   RBC 4.15 3.87 - 5.11 MIL/uL   Hemoglobin 13.7 12.0 - 15.0 g/dL   HCT 39.8 36.0 - 46.0 %   MCV 95.9 80.0 - 100.0 fL   MCH 33.0 26.0 - 34.0 pg   MCHC 34.4 30.0 - 36.0 g/dL   RDW 13.0 11.5 - 15.5 %   Platelets 365 150 - 400 K/uL   nRBC 0.0 0.0 - 0.2 %   Neutrophils Relative % 68 %   Neutro Abs 8.5 (H) 1.7 - 7.7 K/uL   Lymphocytes Relative 17 %   Lymphs Abs 2.1 0.7 - 4.0 K/uL   Monocytes Relative 11 %   Monocytes Absolute 1.3 (H) 0.1 - 1.0 K/uL   Eosinophils  Relative 2 %   Eosinophils Absolute 0.3 0.0 - 0.5 K/uL   Basophils Relative 1 %   Basophils Absolute 0.1 0.0 - 0.1 K/uL   Immature Granulocytes 1 %   Abs Immature Granulocytes 0.10 (H) 0.00 - 0.07 K/uL    Comment: Performed at [facility], [address] [place], [place] [address]  Troponin I (High Sensitivity)     Status: None   Collection Time: 03/25/22  1:00 PM  Result Value Ref Range   Troponin I (High Sensitivity) 9 <18 ng/L    Comment: (NOTE) Elevated high sensitivity troponin I (hsTnI) values and significant  changes across serial measurements may suggest ACS but many other  chronic and acute conditions are known to elevate hsTnI results.  Refer to the "Links" section for chest pain algorithms and additional  guidance. Performed at [facility], [address] [place], [place] [address]   Magnesium     Status: None   Collection Time: 03/25/22  1:00 PM  Result Value Ref Range   Magnesium 2.2 1.7 - 2.4 mg/dL    Comment: Performed at [facility], [address] [place], [place] [address]  Troponin I (High Sensitivity)     Status: None   Collection Time: 03/25/22  3:24 PM  Result Value Ref Range   Troponin I (High Sensitivity) 8 <18 ng/L    Comment: (NOTE) Elevated high sensitivity troponin I (hsTnI) values and significant  changes across serial measurements may suggest ACS but many other  chronic and acute conditions are known to elevate hsTnI results.  Refer to the "Links" section for chest pain algorithms and additional  guidance. Performed at [facility], [address] [place], [place] [address]    CT [facility] HEAD NECK W WO CM  Result Date: 03/25/2022 CLINICAL DATA:  Stroke, follow up. Dizziness. Numbness/tingling involving the left face and left hand which has resolved. EXAM: CT ANGIOGRAPHY HEAD AND NECK TECHNIQUE: Multidetector CT imaging of the head and neck was performed using the standard protocol during bolus  administration of intravenous contrast. Multiplanar CT image reconstructions and MIPs were obtained to evaluate the vascular anatomy. Carotid stenosis measurements (when applicable) are obtained utilizing NASCET criteria, using the distal internal  carotid diameter as the denominator. RADIATION DOSE REDUCTION: This exam was performed according to the departmental dose-optimization program which includes automated exposure control, adjustment of the mA and/or kV according to patient size and/or use of iterative reconstruction technique. CONTRAST:  46m OMNIPAQUE IOHEXOL 350 MG/ML SOLN COMPARISON:  None Available. FINDINGS: CTA NECK FINDINGS Aortic arch: Standard 3 vessel aortic arch with a moderate amount of calcified and soft plaque. Patent brachiocephalic and subclavian arteries with heavily calcified plaque at the left subclavian origin resulting in 60% stenosis. Right carotid system: Patent with a small amount of soft plaque at the carotid bifurcation and a small amount of calcified plaque in the proximal ICA. No evidence of an associated significant stenosis or dissection. Prominent beading of the mid and distal cervical ICA. Left carotid system: Patent with scattered calcified and soft plaque throughout the common carotid artery not resulting in significant stenosis. Soft plaque in the proximal ICA results in 65% stenosis. Prominent beading of the mid and distal cervical ICA. Vertebral arteries: Patent and codominant with calcified plaque at the vertebral origins resulting in mild stenosis bilaterally. Skeleton: Cervical disc degeneration, most severe at C5-6. Moderately advanced cervical facet arthrosis with multilevel degenerative grade 1 anterolisthesis. Other neck: No evidence of cervical lymphadenopathy or mass. Upper chest: No apical lung consolidation or mass. Review of the MIP images confirms the above findings CTA HEAD FINDINGS Anterior circulation: The internal carotid arteries are patent from skull  base to carotid termini with mild-to-moderate calcified atherosclerosis bilaterally not resulting in a significant stenosis. ACAs and MCAs are patent without evidence of a proximal branch occlusion or significant proximal stenosis. No aneurysm is identified. Posterior circulation: The intracranial vertebral arteries are widely patent to the basilar. Patent PICA and SCA origins are seen bilaterally. The basilar artery is patent with mild stenosis in its midportion. Posterior communicating arteries are diminutive or absent. The PCAs are patent with mild atherosclerotic irregularity but no flow limiting proximal stenosis. No aneurysm is identified. Venous sinuses: Patent. Anatomic variants: None. Review of the MIP images confirms the above findings IMPRESSION: 1. No large vessel occlusion. 2. 65% proximal left ICA stenosis. 3. Prominent beading of the cervical internal carotid arteries consistent with fibromuscular dysplasia. 4. 60% stenosis of the proximal left subclavian artery. 5. Mild stenosis of both vertebral artery origins. 6. Aortic Atherosclerosis (ICD10-I70.0). Electronically Signed   By: ALogan BoresM.D.   On: 03/25/2022 14:59   CT Head Wo Contrast  Result Date: 03/25/2022 CLINICAL DATA:  Dizziness.  Sensation that she is going to faint. EXAM: CT HEAD WITHOUT CONTRAST TECHNIQUE: Contiguous axial images were obtained from the base of the skull through the vertex without intravenous contrast. RADIATION DOSE REDUCTION: This exam was performed according to the departmental dose-optimization program which includes automated exposure control, adjustment of the mA and/or kV according to patient size and/or use of iterative reconstruction technique. COMPARISON:  03/18/2022 FINDINGS: Brain: No evidence of acute infarction, hemorrhage, hydrocephalus, extra-axial collection or mass lesion/mass effect. Mild ventricular sulcal enlargement reflecting age-appropriate volume loss. Patchy white matter hypoattenuation  noted bilaterally consistent with mild to moderate chronic microvascular ischemic change. Vascular: No hyperdense vessel or unexpected calcification. Skull: Normal. Negative for fracture or focal lesion. Sinuses/Orbits: Globes and orbits are unremarkable. Sinuses are clear. Other: None. IMPRESSION: 1. No acute intracranial abnormalities. Electronically Signed   By: DLajean ManesM.D.   On: 03/25/2022 13:40    Pending Labs Unresulted Labs (From admission, onward)    None  Vitals/Pain Today's Vitals   03/25/22 1345 03/25/22 1430 03/25/22 1445 03/25/22 1545  BP: (!) 155/98 (!) 174/64 (!) 169/68 (!) 153/65  Pulse: 79 73 78 74  Resp:  '14 11 14  '$ Temp:      TempSrc:      SpO2: 99% 99% 99% 97%  Weight:      Height:      PainSc:        Isolation Precautions No active isolations  Medications Medications  aspirin chewable tablet 81 mg (81 mg Oral Given 03/25/22 1402)  clopidogrel (PLAVIX) tablet 75 mg (75 mg Oral Given 03/25/22 1402)  iohexol (OMNIPAQUE) 350 MG/ML injection 75 mL (75 mLs Intravenous Contrast Given 03/25/22 1409)    Mobility walks with device Low fall risk   Focused Assessments Neuro Assessment Handoff:  Swallow screen pass? Yes    NIH Stroke Scale ( + Modified Stroke Scale Criteria)  Interval: Initial Level of Consciousness (1a.)   : Alert, keenly responsive LOC Questions (1b. )   +: Answers both questions correctly LOC Commands (1c. )   + : Performs both tasks correctly Best Gaze (2. )  +: Normal Visual (3. )  +: No visual loss Facial Palsy (4. )    : Normal symmetrical movements Motor Arm, Left (5a. )   +: No drift Motor Arm, Right (5b. )   +: No drift Motor Leg, Left (6a. )   +: No drift Motor Leg, Right (6b. )   +: No drift Limb Ataxia (7. ): Absent Sensory (8. )   +: (S) Mild-to-moderate sensory loss, patient feels pinprick is less sharp or is dull on the affected side, or there is a loss of superficial pain with pinprick, but patient is aware of  being touched (left arm felt less) Best Language (9. )   +: No aphasia Dysarthria (10. ): Normal Extinction/Inattention (11.)   +: No Abnormality Modified SS Total  +: 1 Complete NIHSS TOTAL: 1     Neuro Assessment: Exceptions to WDL Neuro Checks:   Initial (03/25/22 1300)  Last Documented NIHSS Modified Score: 1 (03/25/22 1300) Has TPA been given? No If patient is a Neuro Trauma and patient is going to OR before floor call report to [place] nurse: [phone] or [phone]   R Recommendations: See Admitting Provider Note  Report given to:   Additional Notes:

## 2022-03-25 NOTE — Assessment & Plan Note (Signed)
Presenting with transient numbness and tingling of the lower left side of face, now resolved.  Neurology recommending admission for CVA work-up. -CT head negative -CTA head/neck negative for emergent LVO, 75% proximal left ICA stenosis and 60% proximal left subclavian artery stenosis noted -MRI brain pending -Started on aspirin 81 mg and Plavix 75 mg daily -Obtain echocardiogram -Check A1c and lipid panel -PT/OT/SLP eval -Keep on telemetry, continue neurochecks -Allow permissive hypertension for now

## 2022-03-25 NOTE — H&P (Signed)
History and Physical    Gina Stewart:[id number] DOB: 1930/02/02 DOA: 03/25/2022  PCP: Derrill Center., MD  Patient coming from: Home  I have personally briefly reviewed patient's old medical records in [place]  Chief Complaint: Left lower facial numbness and tingling  HPI: Gina Stewart is a 88 y.o. female with medical history significant for HTN, HLD, CKD stage IIIa, hypothyroidism who presented to the ED for evaluation of transient left lower facial numbness and tingling.  Patient reports new onset of left lower facial numbness and tingling being around 10 AM on 03/25/2022.  This lasted about 30 minutes.  She did not have any associated facial droop, dysarthria, involvement of her extremities, focal weakness, confusion, nausea, vomiting, bowel pain, chest pain, palpitations, dysuria. Patient also reports chronic issues with intermittent dizziness.  [place] ED Course  Labs/Imaging on admission: I have personally reviewed following labs and imaging studies.  Initial vitals showed BP 167/74, pulse 81, RR 18, temp 98.1 F, SPO2 98% on room air.  Labs showed WBC 12.3, hemoglobin 30.7, platelets 365,000, sodium 138, potassium 4.0, bicarb 28, BUN 23, creatinine 0.89, serum glucose 107, LFTs within normal limits, troponin 9 > 8, magnesium 2.2.  CT head without contrast negative for acute intracranial normalities.  CTA head/neck negative for large vessel occlusion.  65% proximal left ICA stenosis and 60% stenosis of the proximal left subclavian artery noted.  Prominent beading of the cervical internal carotid arteries consistent with fibromuscular dysplasia and mild stenosis of both vertebral artery origins noted.  EDP discussed with on-call neurology who recommended medical admission for further CVA work-up.  Patient was started on aspirin 81 mg and Plavix 75 mg daily.  The hospitalist service was consulted to admit for further evaluation and management.  Review  of Systems: All systems reviewed and are negative except as documented in history of present illness above.   Past Medical History:  Diagnosis Date   Cancer ([place])    GERD (gastroesophageal reflux disease)    High cholesterol    Hypertension    Thyroid disease     Past Surgical History:  Procedure Laterality Date   APPENDECTOMY     BACK SURGERY     CATARACT EXTRACTION     CHOLECYSTECTOMY     HERNIA REPAIR     NASAL SEPTUM SURGERY     NEPHRECTOMY Right    TONSILLECTOMY      Social History:  reports that she has never smoked. She has never used smokeless tobacco. She reports that she does not drink alcohol and does not use drugs.  Allergies  Allergen Reactions   Acrylic Polymer [Carbomer]    Ceclor [Cefaclor]    Cephalosporins    Metronidazole    Morphine And Related    Other     Rubber   Penicillins    Septra [Sulfamethoxazole-Trimethoprim]    Tramadol     History reviewed. No pertinent family history.   Prior to Admission medications   Medication Sig Start Date End Date Taking? Authorizing Provider  beclomethasone (QVAR) 80 MCG/ACT inhaler Inhale into the lungs 2 (two) times daily.    [provider]  labetalol (NORMODYNE) 200 MG tablet Take 200 mg by mouth 2 (two) times daily.    [provider]  levothyroxine (SYNTHROID, LEVOTHROID) 75 MCG tablet Take 75 mcg by mouth daily before breakfast.    [provider]  omeprazole (PRILOSEC) 40 MG capsule Take 40 mg by mouth daily.  [provider]  Pitavastatin Calcium (LIVALO) 2 MG TABS Take by mouth.    [provider]  ranitidine (ZANTAC) 300 MG capsule Take 300 mg by mouth every evening.    [provider]    Physical Exam: Vitals:   03/25/22 1700 03/25/22 1702 03/25/22 1830 03/25/22 2043  BP: (!) 157/81  (!) 142/57 (!) 158/67  Pulse: 93  74 77  Resp:   15 14  Temp:  98.1 F (36.7 C)    TempSrc:  Oral  Oral  SpO2: 100%  100% 98%  Weight:      Height:        Constitutional: Resting in bed, NAD, calm, comfortable Eyes: EOMI, lids and conjunctivae normal ENMT: Mucous membranes are moist. Posterior pharynx clear of any exudate or lesions.Normal dentition.  Neck: normal, supple, no masses. Respiratory: clear to auscultation bilaterally, no wheezing, no crackles. Normal respiratory effort. No accessory muscle use.  Cardiovascular: Regular rate and rhythm, no murmurs / rubs / gallops. No extremity edema. 2+ pedal pulses. Abdomen: no tenderness, no masses palpated. No hepatosplenomegaly. Bowel sounds positive.  Musculoskeletal: no clubbing / cyanosis. No joint deformity upper and lower extremities. Good ROM, no contractures. Normal muscle tone.  Skin: no rashes, lesions, ulcers. No induration Neurologic: CN 2-12 grossly intact. Sensation intact. Strength 5/5 in all 4.  Psychiatric: Normal judgment and insight. Alert and oriented x 3. Normal mood.   EKG: Personally reviewed. Sinus rhythm, no acute ischemic changes.  Assessment/Plan Principal Problem:   Numbness and tingling of left side of face Active Problems:   Benign essential hypertension   Acquired hypothyroidism   Hyperlipidemia LDL goal <70   Gina Stewart is a 88 y.o. female with medical history significant for HTN, HLD, CKD stage IIIa, hypothyroidism who presented with transient left lower facial numbness and tingling and admitted for CVA work-up.  Assessment and Plan: * Numbness and tingling of left side of face Presenting with transient numbness and tingling of the lower left side of face, now resolved.  Neurology recommending admission for CVA work-up. -CT head negative -CTA head/neck negative for emergent LVO, 75% proximal left ICA stenosis and 60% proximal left subclavian artery stenosis noted -MRI brain pending -Started on aspirin 81 mg and Plavix 75 mg daily -Obtain echocardiogram -Check A1c and lipid panel -PT/OT/SLP eval -Keep on telemetry, continue  neurochecks -Allow permissive hypertension for now  Benign essential hypertension Holding antihypertensives to allow permissive hypertension for now.  Hyperlipidemia LDL goal <70 Continue Zetia.  Not currently on statin, would add high intensity atorvastatin or rosuvastatin if no history of intolerance.  Acquired hypothyroidism Continue Synthroid.  DVT prophylaxis: enoxaparin (LOVENOX) injection 30 mg Start: 03/25/22 2215 Code Status: Full code, confirmed on admission Family Communication: Discussed with patient, she has discussed with close acquaintances Disposition Plan: From home and likely discharge to home pending clinical progress Consults called: Neurology Severity of Illness: The appropriate patient status for this patient is OBSERVATION. Observation status is judged to be reasonable and necessary in order to provide the required intensity of service to ensure the patient's safety. The patient's presenting symptoms, physical exam findings, and initial radiographic and laboratory data in the context of their medical condition is felt to place them at decreased risk for further clinical deterioration. Furthermore, it is anticipated that the patient will be medically stable for discharge from the hospital within 2 midnights of admission.   Zada Finders MD [facility]  If 7PM-7AM, please contact night-coverage [email or web address]  03/25/2022, 10:33  PM  

## 2022-03-25 NOTE — ED Provider Notes (Signed)
Emergency Department Provider Note   I have reviewed the triage vital signs and the nursing notes.   HISTORY  Chief Complaint Dizziness   HPI Gina Stewart is a 88 y.o. female with past history reviewed below presents emergency department with lightheadedness starting around midnight.  Patient states that she has a prior history of vertigo but this was not her experience last night.  She woke up not feeling well and felt somewhat lightheaded.  She denied any chest discomfort or heart palpitations.  No shortness of breath.  No fevers or chills.  She was able to get back to sleep but today has continued to feel lightheaded especially with standing and walking.  She was able to make it to breakfast with her friend and noticed some "tingling and numbness" feeling to the left face and jaw. She notes some associated tingling in the left pinky finger but has had this issue in the past. No leg symptoms.  She tells me that she feels like her gait when she walks is "off" but attributes that mainly to feeling lightheaded.  She states the numbness has resolved but with this constellation of symptoms came in for evaluation.  Of note, she states that she has had issues this summer with abnormal potassium and sodium levels.  She saw her PCP on Wednesday and was found to have elevated potassium and was restarted on her Lokelma.  Has been taking that again since Wednesday with no issues.   Past Medical History:  Diagnosis Date   Cancer ([place])    GERD (gastroesophageal reflux disease)    High cholesterol    Hypertension    Thyroid disease     Review of Systems  Constitutional: No fever/chills. Patient with lightheadedness.  Eyes: No visual changes. ENT: No sore throat. Cardiovascular: Denies chest pain. Respiratory: Denies shortness of breath. Gastrointestinal: No abdominal pain.  No nausea, no vomiting.  No diarrhea.  No constipation. Genitourinary: Negative for dysuria. Musculoskeletal:  Negative for back pain. Skin: Negative for rash. Neurological: Negative for headaches, focal weakness. ? Gait instability and tingling to the left face (resolved).   ____________________________________________   PHYSICAL EXAM:  VITAL SIGNS: ED Triage Vitals  Enc Vitals Group     BP 03/25/22 1300 (!) 167/74     Pulse Rate 03/25/22 1300 81     Resp 03/25/22 1300 18     Temp 03/25/22 1300 98.1 F (36.7 C)     Temp Source 03/25/22 1300 Oral     SpO2 03/25/22 1300 98 %     Weight 03/25/22 1257 108 lb (49 kg)     Height 03/25/22 1257 5' (1.524 m)   Constitutional: Alert and oriented. Well appearing and in no acute distress. Eyes: Conjunctivae are normal.  Head: Atraumatic. Nose: No congestion/rhinnorhea. Mouth/Throat: Mucous membranes are moist.  Neck: No stridor.   Cardiovascular: Normal rate, regular rhythm. Good peripheral circulation. Grossly normal heart sounds.   Respiratory: Normal respiratory effort.  No retractions. Lungs CTAB. Gastrointestinal: Soft and nontender. No distention.  Musculoskeletal: No lower extremity tenderness nor edema. No gross deformities of extremities. Neurologic:  Normal speech and language. No gross focal neurologic deficits are appreciated. Normal sensation to the face, arms, and legs. No pronator drift. Normal finger to nose.  Skin:  Skin is warm, dry and intact. No rash noted.  ____________________________________________   LABS (all labs ordered are listed, but only abnormal results are displayed)  Labs Reviewed  COMPREHENSIVE METABOLIC PANEL - Abnormal; Notable for the  following components:      Result Value   Glucose, Bld 107 (*)    All other components within normal limits  CBC WITH DIFFERENTIAL/PLATELET - Abnormal; Notable for the following components:   WBC 12.3 (*)    Neutro Abs 8.5 (*)    Monocytes Absolute 1.3 (*)    Abs Immature Granulocytes 0.10 (*)    All other components within normal limits  MAGNESIUM  TROPONIN I (HIGH  SENSITIVITY)  TROPONIN I (HIGH SENSITIVITY)   ____________________________________________  EKG   EKG Interpretation  Date/Time:  Sunday March 25 2022 13:02:46 EDT Ventricular Rate:  79 PR Interval:  110 QRS Duration: 78 QT Interval:  375 QTC Calculation: 430 R Axis:   59 Text Interpretation: Sinus rhythm Borderline short PR interval Nonspecific T abnormalities, lateral leads Confirmed by Nanda Quinton [phone]) on 03/25/2022 1:18:54 PM        ____________________________________________  RADIOLOGY  CT Head Wo Contrast  Result Date: 03/25/2022 CLINICAL DATA:  Dizziness.  Sensation that she is going to faint. EXAM: CT HEAD WITHOUT CONTRAST TECHNIQUE: Contiguous axial images were obtained from the base of the skull through the vertex without intravenous contrast. RADIATION DOSE REDUCTION: This exam was performed according to the departmental dose-optimization program which includes automated exposure control, adjustment of the mA and/or kV according to patient size and/or use of iterative reconstruction technique. COMPARISON:  03/18/2022 FINDINGS: Brain: No evidence of acute infarction, hemorrhage, hydrocephalus, extra-axial collection or mass lesion/mass effect. Mild ventricular sulcal enlargement reflecting age-appropriate volume loss. Patchy white matter hypoattenuation noted bilaterally consistent with mild to moderate chronic microvascular ischemic change. Vascular: No hyperdense vessel or unexpected calcification. Skull: Normal. Negative for fracture or focal lesion. Sinuses/Orbits: Globes and orbits are unremarkable. Sinuses are clear. Other: None. IMPRESSION: 1. No acute intracranial abnormalities. Electronically Signed   By: Lajean Manes M.D.   On: 03/25/2022 13:40    ____________________________________________   PROCEDURES  Procedure(s) performed:   Procedures  None  ____________________________________________   INITIAL IMPRESSION / ASSESSMENT AND PLAN / ED  COURSE  Pertinent labs & imaging results that were available during my care of the patient were reviewed by me and considered in my medical decision making (see chart for details).   This patient is Presenting for Evaluation of tingling, which does require a range of treatment options, and is a complaint that involves a high risk of morbidity and mortality.  The Differential Diagnoses include dehydration, electrolyte disturbance, TIA, complex migraine, cervical radiculopathy, ACS, etc.  Critical Interventions-    Medications  aspirin chewable tablet 81 mg (81 mg Oral Given 03/25/22 1402)  clopidogrel (PLAVIX) tablet 75 mg (75 mg Oral Given 03/25/22 1402)  iohexol (OMNIPAQUE) 350 MG/ML injection 75 mL (75 mLs Intravenous Contrast Given 03/25/22 1409)    Reassessment after intervention: patient remains without symptoms.    I did obtain Additional Historical Information from friend at bedside.   I decided to review pertinent External Data, and in summary patient seen with dizziness on 8/27 with dizziness which improved after IVF.   Clinical Laboratory Tests Ordered, included troponin within normal limits.  Mild leukocytosis without anemia.  Potassium, magnesium, sodium all within normal limits.  Radiologic Tests Ordered, included CT head. I independently interpreted the images and agree with radiology interpretation.   Cardiac Monitor Tracing which shows NSR.    Social Determinants of Health Risk patient is a non-smoker.   Consult complete with Neurology, Dr. Quinn Axe. Plan for CTA head and neck here and admit for TIA  evaluation.   Hospitalist, Dr. Rogers Blocker. Plan for admit.   Medical Decision Making: Summary:  Patient presents to the emergency department for evaluation of lightheadedness different from her typical dizziness and tingling to the face.  The tingling in the little finger seems more chronic.  No focal neurodeficits on my exam here.  No acute ischemic change on EKG although does have  some nonspecific ST changes.  Do plan to add troponin in the setting.  No other acute arrhythmia.  Reevaluation with update and discussion with patient and friend at bedside.  Discussed plan for CTA head and neck and admit for TIA evaluation.  They are in agreement with plan.   Disposition: admit   ____________________________________________  FINAL CLINICAL IMPRESSION(S) / ED DIAGNOSES  Final diagnoses:  TIA (transient ischemic attack)    Note:  This document was prepared using Dragon voice recognition software and may include unintentional dictation errors.  Nanda Quinton, MD, [facility] Emergency Medicine    Britanie Harshman, [facility], MD 03/25/22 1452

## 2022-03-25 NOTE — Hospital Course (Signed)
Gina Stewart is a 88 y.o. female with medical history significant for HTN, HLD, CKD stage IIIa, hypothyroidism who presented with transient left lower facial numbness and tingling and admitted for CVA work-up.

## 2022-03-25 NOTE — Assessment & Plan Note (Signed)
Continue Synthroid °

## 2022-03-25 NOTE — Assessment & Plan Note (Signed)
Continue Zetia.  Not currently on statin, would add high intensity atorvastatin or rosuvastatin if no history of intolerance.

## 2022-03-25 NOTE — ED Notes (Signed)
[facility].

## 2022-03-25 NOTE — Assessment & Plan Note (Signed)
Holding antihypertensives to allow permissive hypertension for now. 

## 2022-03-25 NOTE — ED Triage Notes (Signed)
Pt c/o dizziness that has been going on for some time. States she feels like she is going "to faint". Also having tingling on her left face and numbness and in left hand that started around 10am. It has gone away currently.

## 2022-03-26 ENCOUNTER — Observation Stay (HOSPITAL_BASED_OUTPATIENT_CLINIC_OR_DEPARTMENT_OTHER): Payer: Medicare (Managed Care)

## 2022-03-26 ENCOUNTER — Observation Stay (HOSPITAL_COMMUNITY): Payer: Medicare (Managed Care)

## 2022-03-26 DIAGNOSIS — I10 Essential (primary) hypertension: Secondary | ICD-10-CM | POA: Diagnosis not present

## 2022-03-26 DIAGNOSIS — E785 Hyperlipidemia, unspecified: Secondary | ICD-10-CM

## 2022-03-26 DIAGNOSIS — R202 Paresthesia of skin: Secondary | ICD-10-CM | POA: Diagnosis not present

## 2022-03-26 DIAGNOSIS — G459 Transient cerebral ischemic attack, unspecified: Secondary | ICD-10-CM

## 2022-03-26 DIAGNOSIS — R200 Anesthesia of skin: Secondary | ICD-10-CM | POA: Diagnosis not present

## 2022-03-26 LAB — CBC
HCT: 34.3 % — ABNORMAL LOW (ref 36.0–46.0)
Hemoglobin: 12 g/dL (ref 12.0–15.0)
MCH: 33.5 pg (ref 26.0–34.0)
MCHC: 35 g/dL (ref 30.0–36.0)
MCV: 95.8 fL (ref 80.0–100.0)
Platelets: 302 10*3/uL (ref 150–400)
RBC: 3.58 MIL/uL — ABNORMAL LOW (ref 3.87–5.11)
RDW: 13 % (ref 11.5–15.5)
WBC: 9.6 10*3/uL (ref 4.0–10.5)
nRBC: 0 % (ref 0.0–0.2)

## 2022-03-26 LAB — BASIC METABOLIC PANEL
Anion gap: 9 (ref 5–15)
BUN: 17 mg/dL (ref 8–23)
CO2: 29 mmol/L (ref 22–32)
Calcium: 8.8 mg/dL — ABNORMAL LOW (ref 8.9–10.3)
Chloride: 103 mmol/L (ref 98–111)
Creatinine, Ser: 0.88 mg/dL (ref 0.44–1.00)
GFR, Estimated: >60 mL/min (ref >60)
Glucose, Bld: 109 mg/dL — ABNORMAL HIGH (ref 70–99)
Potassium: 4.3 mmol/L (ref 3.5–5.1)
Sodium: 141 mmol/L (ref 135–145)

## 2022-03-26 LAB — LIPID PANEL
Cholesterol: 231 mg/dL — ABNORMAL HIGH (ref 0–200)
HDL: 80 mg/dL (ref >40)
LDL Cholesterol: 138 mg/dL — ABNORMAL HIGH (ref 0–99)
Total CHOL/HDL Ratio: 2.9 RATIO
Triglycerides: 67 mg/dL (ref <150)
VLDL: 13 mg/dL (ref 0–40)

## 2022-03-26 LAB — ECHOCARDIOGRAM COMPLETE
Area-P 1/2: 1.98 cm2
Calc EF: 55.9 %
Height: 60 in
MV VTI: 1.45 cm2
S' Lateral: 2.8 cm
Single Plane A2C EF: 58 %
Single Plane A4C EF: 55.3 %
Weight: 1728 oz

## 2022-03-26 LAB — HEMOGLOBIN A1C
Hgb A1c MFr Bld: 6.4 % — ABNORMAL HIGH (ref 4.8–5.6)
Mean Plasma Glucose: 136.98 mg/dL

## 2022-03-26 MED ORDER — ASPIRIN 81 MG PO TBEC: 81.0000 mg | DELAYED_RELEASE_TABLET | Freq: Every day | ORAL | 0 refills | Status: DC

## 2022-03-26 MED ORDER — ROSUVASTATIN CALCIUM 20 MG PO TABS
20.0000 mg | ORAL_TABLET | Freq: Every day | ORAL | 3 refills | Status: AC
Start: 2022-03-27 — End: ?

## 2022-03-26 MED ORDER — CLOPIDOGREL BISULFATE 75 MG PO TABS: 75.0000 mg | ORAL_TABLET | Freq: Every day | ORAL | 3 refills | Status: DC

## 2022-03-26 MED ORDER — ROSUVASTATIN CALCIUM 20 MG PO TABS
20.0000 mg | ORAL_TABLET | Freq: Every day | ORAL | Status: DC
  Administered 2022-03-26: 20 mg via ORAL
  Filled 2022-03-26: qty 1

## 2022-03-26 MED ORDER — PANTOPRAZOLE SODIUM 40 MG PO TBEC: 40.0000 mg | DELAYED_RELEASE_TABLET | Freq: Every day | ORAL | 1 refills | Status: DC

## 2022-03-26 NOTE — Progress Notes (Signed)
OT Cancellation Note and Discharge  Patient Details Name: Gina Stewart MRN: [id number] DOB: [birth year]   Cancelled Treatment:    Reason Eval/Treat Not Completed: OT screened, no needs identified, will sign off. Pt walking with PT in hallway without issues. Asked her about ADLs and there are not concerns. Golden [facility], OTR/L [facility] [phone] Office [phone]    Almon Register 03/26/2022, 10:12 AM

## 2022-03-26 NOTE — Discharge Summary (Signed)
PATIENT DETAILS Name: Gina Stewart Age: 88 y.o. Sex: female Date of Birth: 1929/12/10 MRN: [id number]. Admitting Physician: Orma Flaming, MD BJS:[id number], Quin Hoop., MD  Admit Date: 03/25/2022 Discharge date: 03/26/2022  Recommendations for Outpatient Follow-up:  Follow up with PCP in 1-2 weeks Please obtain CMP/CBC in one week Please follow-up with vascular surgery.  Admitted From:  Home  Disposition: Home   Discharge Condition: good  CODE STATUS:   Code Status: Full Code   Diet recommendation:  Diet Order             Diet Heart Room service appropriate? Yes; Fluid consistency: Thin  Diet effective now           Diet - low sodium heart healthy           Diet Carb Modified                    Brief Summary: Gina Stewart is a 88 y.o. female with medical history significant for HTN, HLD, CKD stage IIIa, hypothyroidism who presented with transient left lower facial numbness and tingling and admitted for CVA work-up.  Brief Hospital Course: TIA: Tingling/numbness of left face resolved-MRI brain negative for CVA.  CTA showed proximal left ICA 75% stenosis (does not explain symptoms).  Discussed with neurology-Dr. Johnsie Cancel to discharge-continue aspirin/Plavix x3 weeks followed by Plavix alone.  Has been placed on high intensity statin-Zetia will be continued.  Echo was unremarkable.  She will follow-up with her outpatient physicians for further optimization/risk factor modification.  Known carotid artery stenosis: Per patient-she follows with vascular surgery in [place].  Please see CTA results.  Rest of her medical issues were stable during this short overnight hospitalization.  Discharge Diagnoses:  Principal Problem:   Numbness and tingling of left side of face Active Problems:   Benign essential hypertension   Acquired hypothyroidism   Hyperlipidemia LDL goal <70   Discharge Instructions:  Activity:  As tolerated   Discharge Instructions      Ambulatory referral to Neurology   Complete by: As directed    An appointment is requested in approximately: 8 weeks   Diet - low sodium heart healthy   Complete by: As directed    Diet Carb Modified   Complete by: As directed    Discharge instructions   Complete by: As directed    Follow with Primary MD  Derrill Center., MD in 1-2 weeks  Please follow-up with vascular surgery at [place]-please give them a call and make an appointment in the next 1-2 weeks.  You will get a phone call from neurology office-if you do not hear from them-please give them a call.  Take aspirin/Plavix x3 weeks-after 3 weeks stop aspirin and continue on Plavix.  Please get a complete blood count and chemistry panel checked by your Primary MD at your next visit, and again as instructed by your Primary MD.  Get Medicines reviewed and adjusted: Please take all your medications with you for your next visit with your Primary MD  Laboratory/radiological data: Please request your Primary MD to go over all hospital tests and procedure/radiological results at the follow up, please ask your Primary MD to get all Hospital records sent to his/her office.  In some cases, they will be blood work, cultures and biopsy results pending at the time of your discharge. Please request that your primary care M.D. follows up on these results.  Also Note the following: If you experience worsening of  your admission symptoms, develop shortness of breath, life threatening emergency, suicidal or homicidal thoughts you must seek medical attention immediately by calling 911 or calling your MD immediately  if symptoms less severe.  You must read complete instructions/literature along with all the possible adverse reactions/side effects for all the Medicines you take and that have been prescribed to you. Take any new Medicines after you have completely understood and accpet all the possible adverse reactions/side effects.   Do not drive  when taking Pain medications or sleeping medications (Benzodaizepines)  Do not take more than prescribed Pain, Sleep and Anxiety Medications. It is not advisable to combine anxiety,sleep and pain medications without talking with your primary care practitioner  Special Instructions: If you have smoked or chewed Tobacco  in the last 2 yrs please stop smoking, stop any regular Alcohol  and or any Recreational drug use.  Wear Seat belts while driving.  Please note: You were cared for by a hospitalist during your hospital stay. Once you are discharged, your primary care physician will handle any further medical issues. Please note that NO REFILLS for any discharge medications will be authorized once you are discharged, as it is imperative that you return to your primary care physician (or establish a relationship with a primary care physician if you do not have one) for your post hospital discharge needs so that they can reassess your need for medications and monitor your lab values.   Increase activity slowly   Complete by: As directed       Allergies as of 03/26/2022       Reactions   Acrylic Polymer [carbomer]    Ceclor [cefaclor]    Cephalosporins    Metronidazole    Morphine And Related    Other    Rubber   Penicillins    Septra [sulfamethoxazole-trimethoprim]    Tramadol         Medication List     STOP taking these medications    omeprazole 40 MG capsule Commonly known as: PRILOSEC       TAKE these medications    amLODipine 10 MG tablet Commonly known as: NORVASC Take 10 mg by mouth daily.   aspirin EC 81 MG tablet Take 1 tablet (81 mg total) by mouth daily. Swallow whole. Take along with Plavix for 3 weeks, after 3 weeks-stop aspirin and just continue on Plavix. What changed: additional instructions   clopidogrel 75 MG tablet Commonly known as: PLAVIX Take 1 tablet (75 mg total) by mouth daily. Start taking on: March 27, 2022   ezetimibe 10 MG  tablet Commonly known as: ZETIA Take 10 mg by mouth daily.   famotidine 20 MG tablet Commonly known as: PEPCID Take 20 mg by mouth daily.   levothyroxine 75 MCG tablet Commonly known as: SYNTHROID Take 75 mcg by mouth daily before breakfast.   losartan 100 MG tablet Commonly known as: COZAAR Take 100 mg by mouth daily.   metoprolol tartrate 25 MG tablet Commonly known as: LOPRESSOR Take 25 mg by mouth 2 (two) times daily.   pantoprazole 40 MG tablet Commonly known as: Protonix Take 1 tablet (40 mg total) by mouth daily.   rosuvastatin 20 MG tablet Commonly known as: CRESTOR Take 1 tablet (20 mg total) by mouth daily. Start taking on: March 27, 2022        Follow-up Information     Derrill Center., MD. Schedule an appointment as soon as possible for a visit in 1 week(s).  Specialty: Family Medicine Contact information: [address] [place] [address] [phone]         [facility] Follow up.   Specialty: Neurology Why: Office will call with date/time, If you dont hear from them,please give them a call Contact information: [address] [place]        Miles Costain, MD. Schedule an appointment as soon as possible for a visit in 2 week(s).   Specialty: Vascular Surgery Contact information: [address] [place] [address] [phone]                Allergies  Allergen Reactions   Acrylic Polymer [Carbomer]    Ceclor [Cefaclor]    Cephalosporins    Metronidazole    Morphine And Related    Other     Rubber   Penicillins    Septra [Sulfamethoxazole-Trimethoprim]    Tramadol      Other Procedures/Studies: MR BRAIN WO CONTRAST  Result Date: 03/26/2022 CLINICAL DATA:  88 year old female with dizziness. Left hand numbness. EXAM: MRI HEAD WITHOUT CONTRAST TECHNIQUE: Multiplanar, multiecho pulse sequences of the brain and surrounding  structures were obtained without intravenous contrast. COMPARISON:  CT head, CTA head and neck yesterday. FINDINGS: Brain: No restricted diffusion to suggest acute infarction. No midline shift, mass effect, evidence of mass lesion, ventriculomegaly, extra-axial collection or acute intracranial hemorrhage. Cervicomedullary junction and pituitary are within normal limits. Cerebral volume is within normal limits for age. Patchy, scattered moderate for age cerebral white matter T2 and FLAIR hyperintensity. But no cortical encephalomalacia or chronic cerebral blood products identified. Mild to moderate T2 heterogeneity in the pons. Bilateral deep gray nuclei and cerebellum are normal for age. Vascular: Major intracranial vascular flow voids are preserved. Skull and upper cervical spine: Partially visible cervical spine degeneration with spondylolisthesis C4-C5. At least mild degenerative spinal stenosis there. See series 9, image 12. Visualized bone marrow signal is within normal limits. Sinuses/Orbits: Postoperative changes to both globes. Trace paranasal sinus mucosal thickening. Other: Grossly mastoid air cells are well aerated. Normal visible internal auditory structures. Normal stylomastoid foramina. Negative visible scalp and face. IMPRESSION: 1. No acute intracranial abnormality. 2. Moderate for age signal changes in the cerebral white matter and pons, most commonly due to chronic small vessel disease. 3. Partially visible cervical spine degeneration with at least mild spinal stenosis at C4-C5. If there is suspicion of cervical myelopathy dedicated Cervical Spine MRI without contrast would best evaluate further. Electronically Signed   By: Genevie Ann M.D.   On: 03/26/2022 10:25   ECHOCARDIOGRAM COMPLETE  Result Date: 03/26/2022    ECHOCARDIOGRAM REPORT   Patient Name:   AAYLAH POKORNY Hatcher Date of Exam: 03/26/2022 Medical Rec #:  [id number]        Height:       60.0 in Accession #:    [id number]       Weight:        108.0 lb Date of Birth:  10-05-1929        BSA:          1.437 m Patient Age:    82 years         BP:           145/72 mmHg Patient Gender: F                HR:           78 bpm. Exam Location:  Inpatient Procedure: 2D Echo, Cardiac Doppler and Color Doppler Indications:    TIA G45.9  History:        Patient has no prior history of Echocardiogram examinations.                 Risk Factors:GERD and Hypertension.  Sonographer:    Bernadene Person RDCS Referring Phys: [id number] [place]  1. Left ventricular ejection fraction, by estimation, is 60 to 65%. The left ventricle has normal function. The left ventricle has no regional wall motion abnormalities. Left ventricular diastolic parameters are indeterminate.  2. Right ventricular systolic function is normal. The right ventricular size is normal. There is normal pulmonary artery systolic pressure. The estimated right ventricular systolic pressure is 09.2 mmHg.  3. Left atrial size was mildly dilated.  4. The mitral valve is degenerative. Trivial mitral valve regurgitation. No evidence of mitral stenosis. Moderate mitral annular calcification.  5. The aortic valve is grossly normal. There is mild calcification of the aortic valve. Aortic valve regurgitation is trivial. Aortic valve sclerosis/calcification is present, without any evidence of aortic stenosis.  6. The inferior vena cava is normal in size with greater than 50% respiratory variability, suggesting right atrial pressure of 3 mmHg. FINDINGS  Left Ventricle: Left ventricular ejection fraction, by estimation, is 60 to 65%. The left ventricle has normal function. The left ventricle has no regional wall motion abnormalities. The left ventricular internal cavity size was normal in size. There is  no left ventricular hypertrophy. Left ventricular diastolic parameters are indeterminate. Right Ventricle: The right ventricular size is normal. No increase in right ventricular wall thickness. Right  ventricular systolic function is normal. There is normal pulmonary artery systolic pressure. The tricuspid regurgitant velocity is 2.27 m/s, and  with an assumed right atrial pressure of 3 mmHg, the estimated right ventricular systolic pressure is 33.0 mmHg. Left Atrium: Left atrial size was mildly dilated. Right Atrium: Right atrial size was normal in size. Prominent Eustachian valve. Pericardium: There is no evidence of pericardial effusion. Mitral Valve: The mitral valve is degenerative in appearance. Moderate mitral annular calcification. Trivial mitral valve regurgitation. No evidence of mitral valve stenosis. MV peak gradient, 10.2 mmHg. The mean mitral valve gradient is 3.1 mmHg with average heart rate of 74 bpm. Tricuspid Valve: The tricuspid valve is normal in structure. Tricuspid valve regurgitation is trivial. No evidence of tricuspid stenosis. Aortic Valve: The aortic valve is grossly normal. There is mild calcification of the aortic valve. Aortic valve regurgitation is trivial. Aortic valve sclerosis/calcification is present, without any evidence of aortic stenosis. Pulmonic Valve: The pulmonic valve was grossly normal. Pulmonic valve regurgitation is trivial. No evidence of pulmonic stenosis. Aorta: The aortic root is normal in size and structure and the ascending aorta was not well visualized. Venous: The inferior vena cava is normal in size with greater than 50% respiratory variability, suggesting right atrial pressure of 3 mmHg. IAS/Shunts: No atrial level shunt detected by color flow Doppler.  LEFT VENTRICLE PLAX 2D LVIDd:         3.90 cm     Diastology LVIDs:         2.80 cm     LV e' medial:    6.40 cm/s LV PW:         0.70 cm     LV E/e' medial:  13.5 LV IVS:        0.70 cm     LV e' lateral:   6.31 cm/s LVOT diam:  1.90 cm     LV E/e' lateral: 13.7 LV SV:         58 LV SV Index:   40 LVOT Area:     2.84 cm  LV Volumes (MOD) LV vol d, MOD A2C: 45.9 ml LV vol d, MOD A4C: 44.7 ml LV vol s, MOD  A2C: 19.3 ml LV vol s, MOD A4C: 20.0 ml LV SV MOD A2C:     26.6 ml LV SV MOD A4C:     44.7 ml LV SV MOD BP:      25.4 ml RIGHT VENTRICLE RV S prime:     10.90 cm/s TAPSE (M-mode): 2.1 cm LEFT ATRIUM             Index        RIGHT ATRIUM           Index LA diam:        3.90 cm 2.71 cm/m   RA Area:     10.20 cm LA Vol (A2C):   47.6 ml 33.14 ml/m  RA Volume:   20.10 ml  13.99 ml/m LA Vol (A4C):   54.8 ml 38.15 ml/m LA Biplane Vol: 51.4 ml 35.78 ml/m  AORTIC VALVE LVOT Vmax:   96.90 cm/s LVOT Vmean:  65.000 cm/s LVOT VTI:    0.205 m  AORTA Ao Root diam: 2.70 cm Ao Asc diam:  2.90 cm MITRAL VALVE                TRICUSPID VALVE MV Area (PHT): 1.98 cm     TR Peak grad:   20.6 mmHg MV Area VTI:   1.45 cm     TR Vmax:        227.00 cm/s MV Peak grad:  10.2 mmHg MV Mean grad:  3.1 mmHg     SHUNTS MV Vmax:       1.60 m/s     Systemic VTI:  0.20 m MV Vmean:      80.1 cm/s    Systemic Diam: 1.90 cm MV Decel Time: 384 msec MV E velocity: 86.20 cm/s MV A velocity: 151.00 cm/s MV E/A ratio:  0.57 Cherlynn Kaiser MD Electronically signed by Cherlynn Kaiser MD Signature Date/Time: 03/26/2022/8:53:42 AM    Final    CT ANGIO HEAD NECK W WO CM  Result Date: 03/25/2022 CLINICAL DATA:  Stroke, follow up. Dizziness. Numbness/tingling involving the left face and left hand which has resolved. EXAM: CT ANGIOGRAPHY HEAD AND NECK TECHNIQUE: Multidetector CT imaging of the head and neck was performed using the standard protocol during bolus administration of intravenous contrast. Multiplanar CT image reconstructions and MIPs were obtained to evaluate the vascular anatomy. Carotid stenosis measurements (when applicable) are obtained utilizing NASCET criteria, using the distal internal carotid diameter as the denominator. RADIATION DOSE REDUCTION: This exam was performed according to the departmental dose-optimization program which includes automated exposure control, adjustment of the mA and/or kV according to patient size and/or use of  iterative reconstruction technique. CONTRAST:  76m OMNIPAQUE IOHEXOL 350 MG/ML SOLN COMPARISON:  None Available. FINDINGS: CTA NECK FINDINGS Aortic arch: Standard 3 vessel aortic arch with a moderate amount of calcified and soft plaque. Patent brachiocephalic and subclavian arteries with heavily calcified plaque at the left subclavian origin resulting in 60% stenosis. Right carotid system: Patent with a small amount of soft plaque at the carotid bifurcation and a small amount of calcified plaque in the proximal ICA. No evidence of an associated significant stenosis or dissection. Prominent beading  of the mid and distal cervical ICA. Left carotid system: Patent with scattered calcified and soft plaque throughout the common carotid artery not resulting in significant stenosis. Soft plaque in the proximal ICA results in 65% stenosis. Prominent beading of the mid and distal cervical ICA. Vertebral arteries: Patent and codominant with calcified plaque at the vertebral origins resulting in mild stenosis bilaterally. Skeleton: Cervical disc degeneration, most severe at C5-6. Moderately advanced cervical facet arthrosis with multilevel degenerative grade 1 anterolisthesis. Other neck: No evidence of cervical lymphadenopathy or mass. Upper chest: No apical lung consolidation or mass. Review of the MIP images confirms the above findings CTA HEAD FINDINGS Anterior circulation: The internal carotid arteries are patent from skull base to carotid termini with mild-to-moderate calcified atherosclerosis bilaterally not resulting in a significant stenosis. ACAs and MCAs are patent without evidence of a proximal branch occlusion or significant proximal stenosis. No aneurysm is identified. Posterior circulation: The intracranial vertebral arteries are widely patent to the basilar. Patent PICA and SCA origins are seen bilaterally. The basilar artery is patent with mild stenosis in its midportion. Posterior communicating arteries are  diminutive or absent. The PCAs are patent with mild atherosclerotic irregularity but no flow limiting proximal stenosis. No aneurysm is identified. Venous sinuses: Patent. Anatomic variants: None. Review of the MIP images confirms the above findings IMPRESSION: 1. No large vessel occlusion. 2. 65% proximal left ICA stenosis. 3. Prominent beading of the cervical internal carotid arteries consistent with fibromuscular dysplasia. 4. 60% stenosis of the proximal left subclavian artery. 5. Mild stenosis of both vertebral artery origins. 6. Aortic Atherosclerosis (ICD10-I70.0). Electronically Signed   By: Logan Bores M.D.   On: 03/25/2022 14:59   CT Head Wo Contrast  Result Date: 03/25/2022 CLINICAL DATA:  Dizziness.  Sensation that she is going to faint. EXAM: CT HEAD WITHOUT CONTRAST TECHNIQUE: Contiguous axial images were obtained from the base of the skull through the vertex without intravenous contrast. RADIATION DOSE REDUCTION: This exam was performed according to the departmental dose-optimization program which includes automated exposure control, adjustment of the mA and/or kV according to patient size and/or use of iterative reconstruction technique. COMPARISON:  03/18/2022 FINDINGS: Brain: No evidence of acute infarction, hemorrhage, hydrocephalus, extra-axial collection or mass lesion/mass effect. Mild ventricular sulcal enlargement reflecting age-appropriate volume loss. Patchy white matter hypoattenuation noted bilaterally consistent with mild to moderate chronic microvascular ischemic change. Vascular: No hyperdense vessel or unexpected calcification. Skull: Normal. Negative for fracture or focal lesion. Sinuses/Orbits: Globes and orbits are unremarkable. Sinuses are clear. Other: None. IMPRESSION: 1. No acute intracranial abnormalities. Electronically Signed   By: Lajean Manes M.D.   On: 03/25/2022 13:40   CT Head Wo Contrast  Result Date: 03/18/2022 CLINICAL DATA:  Neuro deficit, acute.   Dizziness. EXAM: CT HEAD WITHOUT CONTRAST TECHNIQUE: Contiguous axial images were obtained from the base of the skull through the vertex without intravenous contrast. RADIATION DOSE REDUCTION: This exam was performed according to the departmental dose-optimization program which includes automated exposure control, adjustment of the mA and/or kV according to patient size and/or use of iterative reconstruction technique. COMPARISON:  Head CT 02/05/2020 FINDINGS: Brain: Scattered low-density in the white matter is suggestive for chronic changes. No evidence for acute hemorrhage, mass lesion, midline shift, hydrocephalus or large infarct. Vascular: No hyperdense vessel or unexpected calcification. Skull: Normal. Negative for fracture or focal lesion. Sinuses/Orbits: Mucosal thickening and opacification in the ethmoid air cells. Other: None. IMPRESSION: 1. No acute intracranial abnormality. 2. Low-density in the white matter  is suggestive for chronic small vessel ischemic changes. 3. Ethmoid sinus disease. Electronically Signed   By: Markus Daft M.D.   On: 03/18/2022 13:35   DG Chest Port 1 View  Result Date: 03/18/2022 CLINICAL DATA:  Cough. EXAM: PORTABLE CHEST 1 VIEW COMPARISON:  Chest radiograph 02/10/2022 FINDINGS: Both lungs are clear. No airspace disease or pulmonary edema. Heart and mediastinum are within normal limits. Atherosclerotic calcifications at the aortic arch. Negative for a pneumothorax. No acute bone abnormality. Scattered calcifications most likely associated with costal calcifications. IMPRESSION: No active disease. Electronically Signed   By: Markus Daft M.D.   On: 03/18/2022 13:30     TODAY-DAY OF DISCHARGE:  Subjective:   Gina Stewart today has no headache,no chest abdominal pain,no new weakness tingling or numbness, feels much better wants to go home today.   Objective:   Blood pressure (!) 157/65, pulse 85, temperature 98 F (36.7 C), temperature source Oral, resp. rate 17,  height 5' (1.524 m), weight 49 kg, SpO2 98 %. No intake or output data in the 24 hours ending 03/26/22 1104 Filed Weights   03/25/22 1257  Weight: 49 kg    Exam: Awake Alert, Oriented *3, No new F.N deficits, Normal affect [place].AT,PERRAL Supple Neck,No JVD, No cervical lymphadenopathy appriciated.  Symmetrical Chest wall movement, Good air movement bilaterally, CTAB RRR,No Gallops,Rubs or new Murmurs, No Parasternal Heave +ve B.Sounds, Abd Soft, Non tender, No organomegaly appriciated, No rebound -guarding or rigidity. No Cyanosis, Clubbing or edema, No new Rash or bruise   PERTINENT RADIOLOGIC STUDIES: MR BRAIN WO CONTRAST  Result Date: 03/26/2022 CLINICAL DATA:  88 year old female with dizziness. Left hand numbness. EXAM: MRI HEAD WITHOUT CONTRAST TECHNIQUE: Multiplanar, multiecho pulse sequences of the brain and surrounding structures were obtained without intravenous contrast. COMPARISON:  CT head, CTA head and neck yesterday. FINDINGS: Brain: No restricted diffusion to suggest acute infarction. No midline shift, mass effect, evidence of mass lesion, ventriculomegaly, extra-axial collection or acute intracranial hemorrhage. Cervicomedullary junction and pituitary are within normal limits. Cerebral volume is within normal limits for age. Patchy, scattered moderate for age cerebral white matter T2 and FLAIR hyperintensity. But no cortical encephalomalacia or chronic cerebral blood products identified. Mild to moderate T2 heterogeneity in the pons. Bilateral deep gray nuclei and cerebellum are normal for age. Vascular: Major intracranial vascular flow voids are preserved. Skull and upper cervical spine: Partially visible cervical spine degeneration with spondylolisthesis C4-C5. At least mild degenerative spinal stenosis there. See series 9, image 12. Visualized bone marrow signal is within normal limits. Sinuses/Orbits: Postoperative changes to both globes. Trace paranasal sinus mucosal thickening.  Other: Grossly mastoid air cells are well aerated. Normal visible internal auditory structures. Normal stylomastoid foramina. Negative visible scalp and face. IMPRESSION: 1. No acute intracranial abnormality. 2. Moderate for age signal changes in the cerebral white matter and pons, most commonly due to chronic small vessel disease. 3. Partially visible cervical spine degeneration with at least mild spinal stenosis at C4-C5. If there is suspicion of cervical myelopathy dedicated Cervical Spine MRI without contrast would best evaluate further. Electronically Signed   By: Genevie Ann M.D.   On: 03/26/2022 10:25   ECHOCARDIOGRAM COMPLETE  Result Date: 03/26/2022    ECHOCARDIOGRAM REPORT   Patient Name:   Gina Stewart [facility] Date of Exam: 03/26/2022 Medical Rec #:  [id number]        Height:       60.0 in Accession #:    [id number]  Weight:       108.0 lb Date of Birth:  [birth year]        BSA:          1.437 m Patient Age:    89 years         BP:           145/72 mmHg Patient Gender: F                HR:           78 bpm. Exam Location:  Inpatient Procedure: 2D Echo, Cardiac Doppler and Color Doppler Indications:    TIA G45.9  History:        Patient has no prior history of Echocardiogram examinations.                 Risk Factors:GERD and Hypertension.  Sonographer:    Bernadene Person RDCS Referring Phys: [id number] [place]  1. Left ventricular ejection fraction, by estimation, is 60 to 65%. The left ventricle has normal function. The left ventricle has no regional wall motion abnormalities. Left ventricular diastolic parameters are indeterminate.  2. Right ventricular systolic function is normal. The right ventricular size is normal. There is normal pulmonary artery systolic pressure. The estimated right ventricular systolic pressure is 40.3 mmHg.  3. Left atrial size was mildly dilated.  4. The mitral valve is degenerative. Trivial mitral valve regurgitation. No evidence of mitral stenosis. Moderate mitral  annular calcification.  5. The aortic valve is grossly normal. There is mild calcification of the aortic valve. Aortic valve regurgitation is trivial. Aortic valve sclerosis/calcification is present, without any evidence of aortic stenosis.  6. The inferior vena cava is normal in size with greater than 50% respiratory variability, suggesting right atrial pressure of 3 mmHg. FINDINGS  Left Ventricle: Left ventricular ejection fraction, by estimation, is 60 to 65%. The left ventricle has normal function. The left ventricle has no regional wall motion abnormalities. The left ventricular internal cavity size was normal in size. There is  no left ventricular hypertrophy. Left ventricular diastolic parameters are indeterminate. Right Ventricle: The right ventricular size is normal. No increase in right ventricular wall thickness. Right ventricular systolic function is normal. There is normal pulmonary artery systolic pressure. The tricuspid regurgitant velocity is 2.27 m/s, and  with an assumed right atrial pressure of 3 mmHg, the estimated right ventricular systolic pressure is 75.4 mmHg. Left Atrium: Left atrial size was mildly dilated. Right Atrium: Right atrial size was normal in size. Prominent Eustachian valve. Pericardium: There is no evidence of pericardial effusion. Mitral Valve: The mitral valve is degenerative in appearance. Moderate mitral annular calcification. Trivial mitral valve regurgitation. No evidence of mitral valve stenosis. MV peak gradient, 10.2 mmHg. The mean mitral valve gradient is 3.1 mmHg with average heart rate of 74 bpm. Tricuspid Valve: The tricuspid valve is normal in structure. Tricuspid valve regurgitation is trivial. No evidence of tricuspid stenosis. Aortic Valve: The aortic valve is grossly normal. There is mild calcification of the aortic valve. Aortic valve regurgitation is trivial. Aortic valve sclerosis/calcification is present, without any evidence of aortic stenosis. Pulmonic  Valve: The pulmonic valve was grossly normal. Pulmonic valve regurgitation is trivial. No evidence of pulmonic stenosis. Aorta: The aortic root is normal in size and structure and the ascending aorta was not well visualized. Venous: The inferior vena cava is normal in size with greater than 50% respiratory variability, suggesting right atrial pressure of 3 mmHg. IAS/Shunts: No atrial  level shunt detected by color flow Doppler.  LEFT VENTRICLE PLAX 2D LVIDd:         3.90 cm     Diastology LVIDs:         2.80 cm     LV e' medial:    6.40 cm/s LV PW:         0.70 cm     LV E/e' medial:  13.5 LV IVS:        0.70 cm     LV e' lateral:   6.31 cm/s LVOT diam:     1.90 cm     LV E/e' lateral: 13.7 LV SV:         58 LV SV Index:   40 LVOT Area:     2.84 cm  LV Volumes (MOD) LV vol d, MOD A2C: 45.9 ml LV vol d, MOD A4C: 44.7 ml LV vol s, MOD A2C: 19.3 ml LV vol s, MOD A4C: 20.0 ml LV SV MOD A2C:     26.6 ml LV SV MOD A4C:     44.7 ml LV SV MOD BP:      25.4 ml RIGHT VENTRICLE RV S prime:     10.90 cm/s TAPSE (M-mode): 2.1 cm LEFT ATRIUM             Index        RIGHT ATRIUM           Index LA diam:        3.90 cm 2.71 cm/m   RA Area:     10.20 cm LA Vol (A2C):   47.6 ml 33.14 ml/m  RA Volume:   20.10 ml  13.99 ml/m LA Vol (A4C):   54.8 ml 38.15 ml/m LA Biplane Vol: 51.4 ml 35.78 ml/m  AORTIC VALVE LVOT Vmax:   96.90 cm/s LVOT Vmean:  65.000 cm/s LVOT VTI:    0.205 m  AORTA Ao Root diam: 2.70 cm Ao Asc diam:  2.90 cm MITRAL VALVE                TRICUSPID VALVE MV Area (PHT): 1.98 cm     TR Peak grad:   20.6 mmHg MV Area VTI:   1.45 cm     TR Vmax:        227.00 cm/s MV Peak grad:  10.2 mmHg MV Mean grad:  3.1 mmHg     SHUNTS MV Vmax:       1.60 m/s     Systemic VTI:  0.20 m MV Vmean:      80.1 cm/s    Systemic Diam: 1.90 cm MV Decel Time: 384 msec MV E velocity: 86.20 cm/s MV A velocity: 151.00 cm/s MV E/A ratio:  0.57 Cherlynn Kaiser MD Electronically signed by Cherlynn Kaiser MD Signature Date/Time: 03/26/2022/8:53:42  AM    Final    CT ANGIO HEAD NECK W WO CM  Result Date: 03/25/2022 CLINICAL DATA:  Stroke, follow up. Dizziness. Numbness/tingling involving the left face and left hand which has resolved. EXAM: CT ANGIOGRAPHY HEAD AND NECK TECHNIQUE: Multidetector CT imaging of the head and neck was performed using the standard protocol during bolus administration of intravenous contrast. Multiplanar CT image reconstructions and MIPs were obtained to evaluate the vascular anatomy. Carotid stenosis measurements (when applicable) are obtained utilizing NASCET criteria, using the distal internal carotid diameter as the denominator. RADIATION DOSE REDUCTION: This exam was performed according to the departmental dose-optimization program which includes automated exposure control, adjustment of the mA  and/or kV according to patient size and/or use of iterative reconstruction technique. CONTRAST:  24m OMNIPAQUE IOHEXOL 350 MG/ML SOLN COMPARISON:  None Available. FINDINGS: CTA NECK FINDINGS Aortic arch: Standard 3 vessel aortic arch with a moderate amount of calcified and soft plaque. Patent brachiocephalic and subclavian arteries with heavily calcified plaque at the left subclavian origin resulting in 60% stenosis. Right carotid system: Patent with a small amount of soft plaque at the carotid bifurcation and a small amount of calcified plaque in the proximal ICA. No evidence of an associated significant stenosis or dissection. Prominent beading of the mid and distal cervical ICA. Left carotid system: Patent with scattered calcified and soft plaque throughout the common carotid artery not resulting in significant stenosis. Soft plaque in the proximal ICA results in 65% stenosis. Prominent beading of the mid and distal cervical ICA. Vertebral arteries: Patent and codominant with calcified plaque at the vertebral origins resulting in mild stenosis bilaterally. Skeleton: Cervical disc degeneration, most severe at C5-6. Moderately advanced  cervical facet arthrosis with multilevel degenerative grade 1 anterolisthesis. Other neck: No evidence of cervical lymphadenopathy or mass. Upper chest: No apical lung consolidation or mass. Review of the MIP images confirms the above findings CTA HEAD FINDINGS Anterior circulation: The internal carotid arteries are patent from skull base to carotid termini with mild-to-moderate calcified atherosclerosis bilaterally not resulting in a significant stenosis. ACAs and MCAs are patent without evidence of a proximal branch occlusion or significant proximal stenosis. No aneurysm is identified. Posterior circulation: The intracranial vertebral arteries are widely patent to the basilar. Patent PICA and SCA origins are seen bilaterally. The basilar artery is patent with mild stenosis in its midportion. Posterior communicating arteries are diminutive or absent. The PCAs are patent with mild atherosclerotic irregularity but no flow limiting proximal stenosis. No aneurysm is identified. Venous sinuses: Patent. Anatomic variants: None. Review of the MIP images confirms the above findings IMPRESSION: 1. No large vessel occlusion. 2. 65% proximal left ICA stenosis. 3. Prominent beading of the cervical internal carotid arteries consistent with fibromuscular dysplasia. 4. 60% stenosis of the proximal left subclavian artery. 5. Mild stenosis of both vertebral artery origins. 6. Aortic Atherosclerosis (ICD10-I70.0). Electronically Signed   By: ALogan BoresM.D.   On: 03/25/2022 14:59   CT Head Wo Contrast  Result Date: 03/25/2022 CLINICAL DATA:  Dizziness.  Sensation that she is going to faint. EXAM: CT HEAD WITHOUT CONTRAST TECHNIQUE: Contiguous axial images were obtained from the base of the skull through the vertex without intravenous contrast. RADIATION DOSE REDUCTION: This exam was performed according to the departmental dose-optimization program which includes automated exposure control, adjustment of the mA and/or kV  according to patient size and/or use of iterative reconstruction technique. COMPARISON:  03/18/2022 FINDINGS: Brain: No evidence of acute infarction, hemorrhage, hydrocephalus, extra-axial collection or mass lesion/mass effect. Mild ventricular sulcal enlargement reflecting age-appropriate volume loss. Patchy white matter hypoattenuation noted bilaterally consistent with mild to moderate chronic microvascular ischemic change. Vascular: No hyperdense vessel or unexpected calcification. Skull: Normal. Negative for fracture or focal lesion. Sinuses/Orbits: Globes and orbits are unremarkable. Sinuses are clear. Other: None. IMPRESSION: 1. No acute intracranial abnormalities. Electronically Signed   By: DLajean ManesM.D.   On: 03/25/2022 13:40     PERTINENT LAB RESULTS: CBC: Recent Labs    03/25/22 1300 03/26/22 0327  WBC 12.3* 9.6  HGB 13.7 12.0  HCT 39.8 34.3*  PLT 365 302   CMET CMP     Component Value Date/Time  NA 141 03/26/2022 0327   K 4.3 03/26/2022 0327   CL 103 03/26/2022 0327   CO2 29 03/26/2022 0327   GLUCOSE 109 (H) 03/26/2022 0327   BUN 17 03/26/2022 0327   CREATININE 0.88 03/26/2022 0327   CALCIUM 8.8 (L) 03/26/2022 0327   PROT 6.7 03/25/2022 1300   ALBUMIN 4.0 03/25/2022 1300   AST 20 03/25/2022 1300   ALT 20 03/25/2022 1300   ALKPHOS 58 03/25/2022 1300   BILITOT 0.8 03/25/2022 1300   GFRNONAA >60 03/26/2022 0327   GFRAA  04/14/2010 1138    >60        The eGFR has been calculated using the MDRD equation. This calculation has not been validated in all clinical situations. eGFR's persistently <60 mL/min signify possible Chronic Kidney Disease.    GFR Estimated Creatinine Clearance: 30.5 mL/min (by C-G formula based on SCr of 0.88 mg/dL). No results for input(s): "LIPASE", "AMYLASE" in the last 72 hours. No results for input(s): "CKTOTAL", "CKMB", "CKMBINDEX", "TROPONINI" in the last 72 hours. Invalid input(s): "POCBNP" No results for input(s): "DDIMER" in  the last 72 hours. Recent Labs    03/26/22 0327  HGBA1C 6.4*   Recent Labs    03/26/22 0327  CHOL 231*  HDL 80  LDLCALC 138*  TRIG 67  CHOLHDL 2.9   No results for input(s): "TSH", "T4TOTAL", "T3FREE", "THYROIDAB" in the last 72 hours.  Invalid input(s): "FREET3" No results for input(s): "VITAMINB12", "FOLATE", "FERRITIN", "TIBC", "IRON", "RETICCTPCT" in the last 72 hours. Coags: No results for input(s): "INR" in the last 72 hours.  Invalid input(s): "PT" Microbiology: No results found for this or any previous visit (from the past 240 hour(s)).  FURTHER DISCHARGE INSTRUCTIONS:  Get Medicines reviewed and adjusted: Please take all your medications with you for your next visit with your Primary MD  Laboratory/radiological data: Please request your Primary MD to go over all hospital tests and procedure/radiological results at the follow up, please ask your Primary MD to get all Hospital records sent to his/her office.  In some cases, they will be blood work, cultures and biopsy results pending at the time of your discharge. Please request that your primary care M.D. goes through all the records of your hospital data and follows up on these results.  Also Note the following: If you experience worsening of your admission symptoms, develop shortness of breath, life threatening emergency, suicidal or homicidal thoughts you must seek medical attention immediately by calling 911 or calling your MD immediately  if symptoms less severe.  You must read complete instructions/literature along with all the possible adverse reactions/side effects for all the Medicines you take and that have been prescribed to you. Take any new Medicines after you have completely understood and accpet all the possible adverse reactions/side effects.   Do not drive when taking Pain medications or sleeping medications (Benzodaizepines)  Do not take more than prescribed Pain, Sleep and Anxiety Medications. It  is not advisable to combine anxiety,sleep and pain medications without talking with your primary care practitioner  Special Instructions: If you have smoked or chewed Tobacco  in the last 2 yrs please stop smoking, stop any regular Alcohol  and or any Recreational drug use.  Wear Seat belts while driving.  Please note: You were cared for by a hospitalist during your hospital stay. Once you are discharged, your primary care physician will handle any further medical issues. Please note that NO REFILLS for any discharge medications will be authorized once you are  discharged, as it is imperative that you return to your primary care physician (or establish a relationship with a primary care physician if you do not have one) for your post hospital discharge needs so that they can reassess your need for medications and monitor your lab values.  Total Time spent coordinating discharge including counseling, education and face to face time equals less than 30 minutes.  SignedOren Binet 03/26/2022 11:04 AM

## 2022-03-26 NOTE — Consult Note (Signed)
NEUROLOGY CONSULTATION NOTE   Date of service: March 26, 2022 Patient Name: Gina Stewart MRN:  [id number] DOB:  09/14/1929 Reason for consult: "L facial numbness" Requesting Provider: Jonetta Osgood, MD _ _ _   _ __   _ __ _ _  __ __   _ __   __ _  History of Present Illness  Gina Stewart is a 88 y.o. female with PMH significant for GERD, HTN, HLD, hypothyroidism who was sitting on the couch yesterday AM when she had sudden onset L facial numbness. This was persistent for 30 mins and then went away. She woke up around 0500 and did not think that she slept on her left cheek. No prior similar episode.  No DM2, HTN but has been hard to control. No smokes, no family hx of strokes that she is aware but reports that she is adopted. No hx of afibb.  LKW: 1000 on 03/25/22 mRS: 1 tNKASE: not offered, symptoms resolved. Thrombectomy: not offered, symptoms resolved NIHSS components Score: Comment  1a Level of Conscious 0[x]  1[]  2[]  3[]      1b LOC Questions 0[x]  1[]  2[]       1c LOC Commands 0[x]  1[]  2[]       2 Best Gaze 0[x]  1[]  2[]       3 Visual 0[x]  1[]  2[]  3[]      4 Facial Palsy 0[x]  1[]  2[]  3[]      5a Motor Arm - left 0[x]  1[]  2[]  3[]  4[]  UN[]    5b Motor Arm - Right 0[x]  1[]  2[]  3[]  4[]  UN[]    6a Motor Leg - Left 0[x]  1[]  2[]  3[]  4[]  UN[]    6b Motor Leg - Right 0[x]  1[]  2[]  3[]  4[]  UN[]    7 Limb Ataxia 0[x]  1[]  2[]  3[]  UN[]     8 Sensory 0[x]  1[]  2[]  UN[]      9 Best Language 0[x]  1[]  2[]  3[]      10 Dysarthria 0[x]  1[]  2[]  UN[]      11 Extinct. and Inattention 0[x]  1[]  2[]       TOTAL: 0       ROS   Constitutional Denies weight loss, fever and chills.   HEENT Denies changes in vision and hearing.   Respiratory Denies SOB and cough  CV Denies palpitations and CP   GI Denies abdominal pain, nausea, vomiting and diarrhea.   GU Denies dysuria and urinary frequency.   MSK Denies myalgia and joint pain.   Skin Denies rash and pruritus.   Neurological Denies headache and  syncope.   Psychiatric Denies recent changes in mood. Denies anxiety and depression.    Past History   Past Medical History:  Diagnosis Date   Cancer ([place])    GERD (gastroesophageal reflux disease)    High cholesterol    Hypertension    Thyroid disease    Past Surgical History:  Procedure Laterality Date   APPENDECTOMY     BACK SURGERY     CATARACT EXTRACTION     CHOLECYSTECTOMY     HERNIA REPAIR     NASAL SEPTUM SURGERY     NEPHRECTOMY Right    TONSILLECTOMY     History reviewed. No pertinent family history. Social History   Socioeconomic History   Marital status: Married    Spouse name: Not on file   Number of children: Not on file   Years of education: Not on file   Highest education level: Not on file  Occupational History   Not on file  Tobacco Use  Smoking status: Never   Smokeless tobacco: Never  Substance and Sexual Activity   Alcohol use: No   Drug use: No   Sexual activity: Not on file  Other Topics Concern   Not on file  Social History Narrative   Not on file   Social Determinants of Health   Financial Resource Strain: Not on file  Food Insecurity: Not on file  Transportation Needs: Not on file  Physical Activity: Not on file  Stress: Not on file  Social Connections: Not on file   Allergies  Allergen Reactions   Acrylic Polymer [Carbomer]    Ceclor [Cefaclor]    Cephalosporins    Metronidazole    Morphine And Related    Other     Rubber   Penicillins    Septra [Sulfamethoxazole-Trimethoprim]    Tramadol     Medications   Medications Prior to Admission  Medication Sig Dispense Refill Last Dose   amLODipine (NORVASC) 10 MG tablet Take 10 mg by mouth daily.   03/25/2022   aspirin EC 81 MG tablet Take 81 mg by mouth daily. Swallow whole.   03/25/2022   ezetimibe (ZETIA) 10 MG tablet Take 10 mg by mouth daily.   03/25/2022   famotidine (PEPCID) 20 MG tablet Take 20 mg by mouth daily.   03/25/2022   levothyroxine (SYNTHROID, LEVOTHROID) 75  MCG tablet Take 75 mcg by mouth daily before breakfast.   03/25/2022   losartan (COZAAR) 100 MG tablet Take 100 mg by mouth daily.   03/25/2022   metoprolol tartrate (LOPRESSOR) 25 MG tablet Take 25 mg by mouth 2 (two) times daily.   03/25/2022   omeprazole (PRILOSEC) 40 MG capsule Take 40 mg by mouth daily.   03/25/2022     Vitals   Vitals:   03/25/22 1830 03/25/22 2043 03/26/22 0000 03/26/22 0600  BP: (!) 142/57 (!) 158/67 136/65 (!) 145/72  Pulse: 74 77 70 89  Resp: 15 14 14 14   Temp:  98.1 F (36.7 C) 98 F (36.7 C)   TempSrc:  Oral Oral   SpO2: 100% 98% 95% 97%  Weight:      Height:         Body mass index is 21.09 kg/m.  Physical Exam   General: Laying comfortably in bed; in no acute distress.  HENT: Normal oropharynx and mucosa. Normal external appearance of ears and nose.  Neck: Supple, no pain or tenderness  CV: No JVD. No peripheral edema.  Pulmonary: Symmetric Chest rise. Normal respiratory effort.  Abdomen: Soft to touch, non-tender.  Ext: No cyanosis, edema, or deformity  Skin: No rash. Normal palpation of skin.   Musculoskeletal: Normal digits and nails by inspection. No clubbing.   Neurologic Examination  Mental status/Cognition: Alert, oriented to self, place, month and year, good attention.  Speech/language: Fluent, comprehension intact, object naming intact, repetition intact.  Cranial nerves:   CN II Pupils equal and reactive to light, no VF deficits    CN III,IV,VI EOM intact, no gaze preference or deviation, no nystagmus    CN V normal sensation in V1, V2, and V3 segments bilaterally    CN VII no asymmetry, no nasolabial fold flattening    CN VIII normal hearing to speech    CN IX & X normal palatal elevation, no uvular deviation    CN XI 5/5 head turn and 5/5 shoulder shrug bilaterally    CN XII midline tongue protrusion    Motor:  Muscle bulk: normal, tone normal, pronator  drift none  Mvmt Root Nerve  Muscle Right Left Comments  SA C5/6 Ax Deltoid  5 5   EF C5/6 Mc Biceps 5 5   EE C6/7/8 Rad Triceps 5 5   WF C6/7 Med FCR     WE C7/8 PIN ECU     F Ab C8/T1 U ADM/FDI 5 5   HF L1/2/3 Fem Illopsoas 5 5   KE L2/3/4 Fem Quad 5 5   DF L4/5 D Peron Tib Ant 5 5   PF S1/2 Tibial Grc/Sol 5 5    Reflexes:  Right Left Comments  Pectoralis      Biceps (C5/6) 2 2   Brachioradialis (C5/6) 2 2    Triceps (C6/7) 2 2    Patellar (L3/4) 2 2    Achilles (S1)      Hoffman      Plantar     Jaw jerk    Sensation:  Light touch Intact throughout   Pin prick    Temperature    Vibration   Proprioception    Coordination/Complex Motor:  - Finger to Nose intact BL - Heel to shin intact BL - Rapid alternating movement are normal - Gait: deferred.  Labs   CBC:  Recent Labs  Lab 03/25/22 1300 03/26/22 0327  WBC 12.3* 9.6  NEUTROABS 8.5*  --   HGB 13.7 12.0  HCT 39.8 34.3*  MCV 95.9 95.8  PLT 365 144    Basic Metabolic Panel:  Lab Results  Component Value Date   NA 141 03/26/2022   K 4.3 03/26/2022   CO2 29 03/26/2022   GLUCOSE 109 (H) 03/26/2022   BUN 17 03/26/2022   CREATININE 0.88 03/26/2022   CALCIUM 8.8 (L) 03/26/2022   GFRNONAA >60 03/26/2022   GFRAA  04/14/2010    >60        The eGFR has been calculated using the MDRD equation. This calculation has not been validated in all clinical situations. eGFR's persistently <60 mL/min signify possible Chronic Kidney Disease.   Lipid Panel:  Lab Results  Component Value Date   LDLCALC 138 (H) 03/26/2022   HgbA1c:  Lab Results  Component Value Date   HGBA1C 6.4 (H) 03/26/2022   Urine Drug Screen: No results found for: "LABOPIA", "COCAINSCRNUR", "LABBENZ", "AMPHETMU", "THCU", "LABBARB"  Alcohol Level No results found for: "ETH"  CT Head without contrast(Personally reviewed): CTH was negative for a large hypodensity concerning for a large territory infarct or hyperdensity concerning for an ICH   CT angio Head and Neck with contrast(Personally reviewed): No LVO,  65% L ICA stenosis.  MRI Brain(Personally reviewed): Pending  Impression   Gina Stewart is a 88 y.o. female with PMH significant forGERD, HTN, HLD, hypothyroidism who was sitting on the couch yesterday AM when she had sudden onset L facial numbness. Episode concerning for a TIA.  Primary Diagnosis:  Cerebral infarction, unspecified.  Secondary Diagnosis: Essential (primary) hypertension  Recommendations   - Frequent Neuro checks per stroke unit protocol - Recommend brain imaging with MRI Brain without contrast - Recommend Vascular imaging with CT Angio head and neck - Recommend obtaining TTE  - Recommend obtaining Lipid panel with LDL - Please start statin if LDL > 70 - Recommend HbA1c - Antithrombotic - Aspirin34m daily along with plavix 788mdaily x 21days, followed by Aspirin 8182maily alone. - Recommend DVT ppx - SBP goal - permissive hypertension first 24 h < 220/110. Held home meds.  - Recommend Telemetry monitoring for arrythmia -  Recommend bedside swallow screen prior to PO intake. - Stroke education booklet - Recommend PT/OT/SLP consult   ______________________________________________________________________   Thank you for the opportunity to take part in the care of this patient. If you have any further questions, please contact the neurology consultation attending.  Signed,  [place] Pager Number [phone] _ _ _   _ __   _ __ _ _  __ __   _ __   __ _

## 2022-03-26 NOTE — Evaluation (Signed)
Physical Therapy Evaluation Patient Details Name: Gina Stewart MRN: [id number] DOB: 03-07-1930 Today's Date: 03/26/2022  History of Present Illness  88 yo female presents to [facility] on 9/3 with transient L lower facial N/T. CTA head/neck shows 65% proximal left ICA stenosis and 60% stenosis of the proximal left subclavian artery. PMH includes HTN, HLD, CKD stage IIIa, hypothyroidism.  Clinical Impression   Pt presents with baseline level of strength, activity tolerance, balance, and gait on eval with resolved facial n/t.Marland Kitchen Pt ambulated great hallway distance without use of AD, scored 20/24 on DGI so has some higher level balance deficits but is not technically at risk of falls (19/24 is increased fall risk) and pt attends exercise classes to address balance deficits which are chronic. Pt is eager to d/c home, no further PT needs at this time, thank you.        Recommendations for follow up therapy are one component of a multi-disciplinary discharge planning process, led by the attending physician.  Recommendations may be updated based on patient status, additional functional criteria and insurance authorization.  Follow Up Recommendations No PT follow up      Assistance Recommended at Discharge PRN  Patient can return home with the following       Equipment Recommendations None recommended by PT  Recommendations for Other Services       Functional Status Assessment Patient has not had a recent decline in their functional status     Precautions / Restrictions Precautions Precautions: Fall (moderate) Restrictions Weight Bearing Restrictions: No      Mobility  Bed Mobility Overal bed mobility: Modified Independent             General bed mobility comments: increased time only    Transfers Overall transfer level: Modified independent                 General transfer comment: increased time to rise, no physical assist    Ambulation/Gait Ambulation/Gait  assistance: Supervision Gait Distance (Feet): 550 Feet Assistive device: None Gait Pattern/deviations: Step-through pattern, Drifts right/left Gait velocity: decr     General Gait Details: min drifting L/R with vertical and horizontal head turns, pt very aware of this and corrects without PT cuing  Stairs            Wheelchair Mobility    Modified Rankin (Stroke Patients Only)       Balance Overall balance assessment: Mild deficits observed, not formally tested                               Standardized Balance Assessment Standardized Balance Assessment : Dynamic Gait Index   Dynamic Gait Index Level Surface: Normal Change in Gait Speed: Normal Gait with Horizontal Head Turns: Mild Impairment Gait with Vertical Head Turns: Mild Impairment Gait and Pivot Turn: Normal Step Over Obstacle: Normal Step Around Obstacles: Mild Impairment Steps: Mild Impairment (inferred) Total Score: 20       Pertinent Vitals/Pain Pain Assessment Pain Assessment: No/denies pain    Home Living Family/patient expects to be discharged to:: Private residence Living Arrangements: Alone Available Help at Discharge: Friend(s);Available PRN/intermittently Type of Home: House Home Access: Stairs to enter   Entrance Stairs-Number of Steps: few Alternate Level Stairs-Number of Steps: a few, landing, a few more - pt states "they're easy for me" Home Layout: Two level;Bed/bath upstairs Home Equipment: Kasandra Knudsen - single point      Prior Function Prior Level  of Function : Independent/Modified Independent             Mobility Comments: pt reports using cane on and off since july as needed, states she got covid in [place] and has not been as steady since. Taking balance exercise classes 2x/week since ADLs Comments: indep     Hand Dominance   Dominant Hand: Right    Extremity/Trunk Assessment   Upper Extremity Assessment Upper Extremity Assessment: Overall WFL for tasks  assessed    Lower Extremity Assessment Lower Extremity Assessment: Overall WFL for tasks assessed (at least 4/5 throughout)    Cervical / Trunk Assessment Cervical / Trunk Assessment: Kyphotic  Communication   Communication: No difficulties  Cognition Arousal/Alertness: Awake/alert Behavior During Therapy: WFL for tasks assessed/performed Overall Cognitive Status: Within Functional Limits for tasks assessed                                          General Comments      Exercises     Assessment/Plan    PT Assessment Patient does not need any further PT services  PT Problem List         PT Treatment Interventions      PT Goals (Current goals can be found in the Care Plan section)  Acute Rehab PT Goals Patient Stated Goal: home today PT Goal Formulation: With patient Time For Goal Achievement: 03/26/22 Potential to Achieve Goals: Good    Frequency       Co-evaluation               AM-PAC PT "6 Clicks" Mobility  Outcome Measure Help needed turning from your back to your side while in a flat bed without using bedrails?: None Help needed moving from lying on your back to sitting on the side of a flat bed without using bedrails?: None Help needed moving to and from a bed to a chair (including a wheelchair)?: None Help needed standing up from a chair using your arms (e.g., wheelchair or bedside chair)?: None Help needed to walk in hospital room?: None Help needed climbing 3-5 steps with a railing? : A Little 6 Click Score: 23    End of Session   Activity Tolerance: Patient tolerated treatment well;Patient limited by fatigue Patient left: in chair;with call bell/phone within reach Nurse Communication: Mobility status PT Visit Diagnosis: Other abnormalities of gait and mobility (R26.89)    Time: 8657-8469 PT Time Calculation (min) (ACUTE ONLY): 17 min   Charges:   PT Evaluation $PT Eval Low Complexity: 1 Low         Travares Nelles S, PT  DPT Acute Rehabilitation Services Pager [phone]  Office [phone]  [place] [facility] Ruffin Pyo 03/26/2022, 11:24 AM

## 2022-03-26 NOTE — Progress Notes (Signed)
  Transition of Care [facility]) Screening Note   Patient Details  Name: Gina Stewart Date of Birth: 1930-05-08   Transition of Care [facility]) CM/SW Contact:    Cyndi Bender, RN Phone Number: 03/26/2022, 9:39 AM    Transition of Care Department [facility]) has reviewed patient and no TOC needs have been identified at this time. We will continue to monitor patient advancement through interdisciplinary progression rounds. If new patient transition needs arise, please place a TOC consult.

## 2022-03-26 NOTE — Hospital Course (Signed)
[place]: both sides have narrowing Recent imaging: only L side has narrowing.  Follow up with Dr. Boyd Kerbs for further interventions.

## 2022-03-26 NOTE — Progress Notes (Addendum)
STROKE TEAM PROGRESS NOTE   INTERVAL HISTORY Patient was evaluated at bedside this AM. Reports feeling well, does not have any acute neurological deficits. Mentions she has a history of high potassium for which she takes lokelma. Patient has no acute complaints. Feels ready to go home. Discussed follow up with her OP vascular surgeon in [facility] after discharge.   Vitals:   03/26/22 0000 03/26/22 0600 03/26/22 0800 03/26/22 1220  BP: 136/65 (!) 145/72 (!) 157/65 (!) 147/67  Pulse: 70 89 85 88  Resp: '14 14 17 16  '$ Temp: 98 F (36.7 C)   98 F (36.7 C)  TempSrc: Oral   Oral  SpO2: 95% 97% 98%   Weight:      Height:       CBC:  Recent Labs  Lab 03/25/22 1300 03/26/22 0327  WBC 12.3* 9.6  NEUTROABS 8.5*  --   HGB 13.7 12.0  HCT 39.8 34.3*  MCV 95.9 95.8  PLT 365 096   Basic Metabolic Panel:  Recent Labs  Lab 03/25/22 1300 03/26/22 0327  NA 138 141  K 4.0 4.3  CL 101 103  CO2 28 29  GLUCOSE 107* 109*  BUN 23 17  CREATININE 0.89 0.88  CALCIUM 8.9 8.8*  MG 2.2  --    Lipid Panel:  Recent Labs  Lab 03/26/22 0327  CHOL 231*  TRIG 67  HDL 80  CHOLHDL 2.9  VLDL 13  LDLCALC 138*   HgbA1c:  Recent Labs  Lab 03/26/22 0327  HGBA1C 6.4*   Urine Drug Screen: No results for input(s): "LABOPIA", "COCAINSCRNUR", "LABBENZ", "AMPHETMU", "THCU", "LABBARB" in the last 168 hours.  Alcohol Level No results for input(s): "ETH" in the last 168 hours.  IMAGING past 24 hours MR BRAIN WO CONTRAST  Result Date: 03/26/2022 CLINICAL DATA:  88 year old female with dizziness. Left hand numbness. EXAM: MRI HEAD WITHOUT CONTRAST TECHNIQUE: Multiplanar, multiecho pulse sequences of the brain and surrounding structures were obtained without intravenous contrast. COMPARISON:  CT head, CTA head and neck yesterday. FINDINGS: Brain: No restricted diffusion to suggest acute infarction. No midline shift, mass effect, evidence of mass lesion, ventriculomegaly, extra-axial collection or acute  intracranial hemorrhage. Cervicomedullary junction and pituitary are within normal limits. Cerebral volume is within normal limits for age. Patchy, scattered moderate for age cerebral white matter T2 and FLAIR hyperintensity. But no cortical encephalomalacia or chronic cerebral blood products identified. Mild to moderate T2 heterogeneity in the pons. Bilateral deep gray nuclei and cerebellum are normal for age. Vascular: Major intracranial vascular flow voids are preserved. Skull and upper cervical spine: Partially visible cervical spine degeneration with spondylolisthesis C4-C5. At least mild degenerative spinal stenosis there. See series 9, image 12. Visualized bone marrow signal is within normal limits. Sinuses/Orbits: Postoperative changes to both globes. Trace paranasal sinus mucosal thickening. Other: Grossly mastoid air cells are well aerated. Normal visible internal auditory structures. Normal stylomastoid foramina. Negative visible scalp and face. IMPRESSION: 1. No acute intracranial abnormality. 2. Moderate for age signal changes in the cerebral white matter and pons, most commonly due to chronic small vessel disease. 3. Partially visible cervical spine degeneration with at least mild spinal stenosis at C4-C5. If there is suspicion of cervical myelopathy dedicated Cervical Spine MRI without contrast would best evaluate further. Electronically Signed   By: Genevie Ann M.D.   On: 03/26/2022 10:25   ECHOCARDIOGRAM COMPLETE  Result Date: 03/26/2022    ECHOCARDIOGRAM REPORT   Patient Name:   Gina Stewart Parcel Date of  Exam: 03/26/2022 Medical Rec #:  [id number]        Height:       60.0 in Accession #:    [id number]       Weight:       108.0 lb Date of Birth:  1930-03-13        BSA:          1.437 m Patient Age:    89 years         BP:           145/72 mmHg Patient Gender: F                HR:           78 bpm. Exam Location:  Inpatient Procedure: 2D Echo, Cardiac Doppler and Color Doppler Indications:    TIA G45.9   History:        Patient has no prior history of Echocardiogram examinations.                 Risk Factors:GERD and Hypertension.  Sonographer:    Bernadene Person RDCS Referring Phys: [id number] [place]  1. Left ventricular ejection fraction, by estimation, is 60 to 65%. The left ventricle has normal function. The left ventricle has no regional wall motion abnormalities. Left ventricular diastolic parameters are indeterminate.  2. Right ventricular systolic function is normal. The right ventricular size is normal. There is normal pulmonary artery systolic pressure. The estimated right ventricular systolic pressure is 99.3 mmHg.  3. Left atrial size was mildly dilated.  4. The mitral valve is degenerative. Trivial mitral valve regurgitation. No evidence of mitral stenosis. Moderate mitral annular calcification.  5. The aortic valve is grossly normal. There is mild calcification of the aortic valve. Aortic valve regurgitation is trivial. Aortic valve sclerosis/calcification is present, without any evidence of aortic stenosis.  6. The inferior vena cava is normal in size with greater than 50% respiratory variability, suggesting right atrial pressure of 3 mmHg. FINDINGS  Left Ventricle: Left ventricular ejection fraction, by estimation, is 60 to 65%. The left ventricle has normal function. The left ventricle has no regional wall motion abnormalities. The left ventricular internal cavity size was normal in size. There is  no left ventricular hypertrophy. Left ventricular diastolic parameters are indeterminate. Right Ventricle: The right ventricular size is normal. No increase in right ventricular wall thickness. Right ventricular systolic function is normal. There is normal pulmonary artery systolic pressure. The tricuspid regurgitant velocity is 2.27 m/s, and  with an assumed right atrial pressure of 3 mmHg, the estimated right ventricular systolic pressure is 71.6 mmHg. Left Atrium: Left atrial size was  mildly dilated. Right Atrium: Right atrial size was normal in size. Prominent Eustachian valve. Pericardium: There is no evidence of pericardial effusion. Mitral Valve: The mitral valve is degenerative in appearance. Moderate mitral annular calcification. Trivial mitral valve regurgitation. No evidence of mitral valve stenosis. MV peak gradient, 10.2 mmHg. The mean mitral valve gradient is 3.1 mmHg with average heart rate of 74 bpm. Tricuspid Valve: The tricuspid valve is normal in structure. Tricuspid valve regurgitation is trivial. No evidence of tricuspid stenosis. Aortic Valve: The aortic valve is grossly normal. There is mild calcification of the aortic valve. Aortic valve regurgitation is trivial. Aortic valve sclerosis/calcification is present, without any evidence of aortic stenosis. Pulmonic Valve: The pulmonic valve was grossly normal. Pulmonic valve regurgitation is trivial. No evidence of pulmonic stenosis. Aorta: The aortic root is normal in size  and structure and the ascending aorta was not well visualized. Venous: The inferior vena cava is normal in size with greater than 50% respiratory variability, suggesting right atrial pressure of 3 mmHg. IAS/Shunts: No atrial level shunt detected by color flow Doppler.  LEFT VENTRICLE PLAX 2D LVIDd:         3.90 cm     Diastology LVIDs:         2.80 cm     LV e' medial:    6.40 cm/s LV PW:         0.70 cm     LV E/e' medial:  13.5 LV IVS:        0.70 cm     LV e' lateral:   6.31 cm/s LVOT diam:     1.90 cm     LV E/e' lateral: 13.7 LV SV:         58 LV SV Index:   40 LVOT Area:     2.84 cm  LV Volumes (MOD) LV vol d, MOD A2C: 45.9 ml LV vol d, MOD A4C: 44.7 ml LV vol s, MOD A2C: 19.3 ml LV vol s, MOD A4C: 20.0 ml LV SV MOD A2C:     26.6 ml LV SV MOD A4C:     44.7 ml LV SV MOD BP:      25.4 ml RIGHT VENTRICLE RV S prime:     10.90 cm/s TAPSE (M-mode): 2.1 cm LEFT ATRIUM             Index        RIGHT ATRIUM           Index LA diam:        3.90 cm 2.71 cm/m   RA  Area:     10.20 cm LA Vol (A2C):   47.6 ml 33.14 ml/m  RA Volume:   20.10 ml  13.99 ml/m LA Vol (A4C):   54.8 ml 38.15 ml/m LA Biplane Vol: 51.4 ml 35.78 ml/m  AORTIC VALVE LVOT Vmax:   96.90 cm/s LVOT Vmean:  65.000 cm/s LVOT VTI:    0.205 m  AORTA Ao Root diam: 2.70 cm Ao Asc diam:  2.90 cm MITRAL VALVE                TRICUSPID VALVE MV Area (PHT): 1.98 cm     TR Peak grad:   20.6 mmHg MV Area VTI:   1.45 cm     TR Vmax:        227.00 cm/s MV Peak grad:  10.2 mmHg MV Mean grad:  3.1 mmHg     SHUNTS MV Vmax:       1.60 m/s     Systemic VTI:  0.20 m MV Vmean:      80.1 cm/s    Systemic Diam: 1.90 cm MV Decel Time: 384 msec MV E velocity: 86.20 cm/s MV A velocity: 151.00 cm/s MV E/A ratio:  0.57 Cherlynn Kaiser MD Electronically signed by Cherlynn Kaiser MD Signature Date/Time: 03/26/2022/8:53:42 AM    Final     PHYSICAL EXAM Mental Status: Elderly woman, appears younger than stated age. Patient is awake, alert, and oriented to person, place, month, year, and situation. She communicates coherently and provides a clear history without difficulty. No signs of aphasia or neglect are observed.  Cranial Nerves: II: Pupils are equal, round, and reactive to light. III, IV, VI: Extraocular movements (EOMI) are intact, without ptosis or diplopia. V: Facial sensation is symmetric to light touch,  and facial movement appears symmetric both at rest and during smiling. VIII: Hearing is preserved to voice. X: Palate elevation is symmetric. XI: Bilateral shoulder shrug appears symmetrical. XII: Tongue protrudes midline without observable atrophy or fasciculations.  Motor: Motor strength is full, graded at 5/5, in UE and LE bilaterally. Sensory: Sensation to light touch remains symmetric in UE and LE. Cerebellar: No ataxia is observed in the arms or legs. FNF intact bilaterally.  ASSESSMENT/PLAN Gina Stewart is a 88 y.o. female with history of HTN, HLD, CKD3a, and hypothyroidism presenting with  transient left lower facial numbness and tingling and was admitted for w/u of CVA and what now appears to have been a TIA with full resolution of symptoms.   TIA: likely right brain TIA, likely due to vascular atherosclerosis Code Stroke CT head No acute abnormality. Small vessel disease. ASPECTS 10.    CTA head & neck: 75% proximal left ICA stenosis and 60% proximal left subclavian artery stenosis noted MRI No acute intracranial abnormality. 2D Echo: LVEF 60-65% LDL 138 HgbA1c 6.4 VTE prophylaxis - Lovenox injection 30 mg ASA 81 prior to admission, now on aspirin 81 mg daily and clopidogrel 75 mg daily. Asprin for 3 weeks and then continues Plavix.  Therapy recommendations:  none Disposition:  Home  Asymptomatic left carotid stenosis Follows with Dr. Radene Knee as outpatient Carotid Doppler in 11/2021 showed R 60-79% stenosis and L 60-79% stenosis However this admission CT head and neck showed left ICA 75% stenosis but no significant stenosis on right ICA Continue follow-up with Dr. Radene Knee as outpatient  Hypertension Home meds:  labetalol 200 mg Stable Long-term BP goal normotensive  Hyperlipidemia Home meds:  Zetia 10 mg  QD, resumed in hospital LDL 138, goal < 70 Add Crestor 20 Continue statin at discharge  Other Stroke Risk Factors Advanced Age >/= 14   Hospital day # 0  Christene Slates, MD PGY-1  ATTENDING NOTE: I reviewed above note and agree with the assessment and plan. Pt was seen and examined.   88 year old female with history of hypertension and hyperlipidemia admitted for transient left facial numbness for 30 minutes.  CT no acute finding.  MRI no acute infarct.  CTA head and neck showed left ICA 65% stenosis, bilateral FMD pattern, left subclavian artery 60% stenosis.  EF 60 to 65%, LDL 138, A1c 6.4, creatinine 0.88  On exam, patient AOx3, no aphasia, follows simple commands, neurologic intact no focal deficit.  Etiology of patient's symptoms concerning for  right brain TIA.  Likely small vessel disease.  Currently on aspirin 81 and the Plavix 75 DAPT for 3 weeks and then Plavix alone.  Add Crestor 20 on Zetia 10.  Patient left ICA stenosis likely asymptomatic at this time, will continue follow-up as outpatient with Dr. Vallarie Mare vascular surgery.  For detailed assessment and plan, please refer to above/below as I have made changes wherever appropriate.   Neurology will sign off. Please call with questions. Pt will follow up with stroke clinic NP at [facility] in about 4 weeks. Thanks for the consult.   Rosalin Hawking, MD PhD Stroke Neurology 03/26/2022 4:11 PM    To contact Stroke Continuity provider, please refer to [email or web address] After hours, contact General Neurology

## 2022-03-26 NOTE — Progress Notes (Signed)
Echocardiogram 2D Echocardiogram has been performed.  Fidel Levy 03/26/2022, 8:41 AM

## 2022-05-07 ENCOUNTER — Inpatient Hospital Stay: Payer: Medicare (Managed Care) | Admitting: Adult Health

## 2022-06-05 NOTE — Progress Notes (Unsigned)
[facility] [address] [place]. [place] [address] [phone]       HOSPITAL FOLLOW UP NOTE  [name] Date of Birth:  Jun 01, 1930 Medical Record Number:  [id number]   Reason for Referral:  hospital stroke follow up    SUBJECTIVE:   CHIEF COMPLAINT:  No chief complaint on file.   HPI:   [name] is a 88 y.o. female with history of HTN, HLD, CKD3a, and hypothyroidism who presented on 03/25/2022 with transient left lower facial numbness and tingling.  Evaluated by Dr. Erlinda Hong. CT no acute finding.  MRI no acute infarct.  CTA head and neck showed left ICA 65% stenosis, bilateral FMD pattern, left subclavian artery 60% stenosis.  EF 60 to 65%, LDL 138, A1c 6.4.  Etiology of symptoms concerning for right brain TIA likely secondary to small vessel disease.  Recommended DAPT for 3 weeks then Plavix alone as well as added Crestor 20 mg daily in addition to home dose Zetia.  Also noted left ICA stenosis likely asymptomatic and recommend follow-up outpatient with vascular surgery Dr. Vallarie Mare.        PERTINENT IMAGING  Per hospitalization 03/25/2022 -03/26/2022 Code Stroke CT head No acute abnormality. Small vessel disease. ASPECTS 10.    CTA head & neck: 75% proximal left ICA stenosis and 60% proximal left subclavian artery stenosis noted MRI No acute intracranial abnormality. 2D Echo: LVEF 60-65% LDL 138 HgbA1c 6.4    ROS:   14 system review of systems performed and negative with exception of ***  PMH:  Past Medical History:  Diagnosis Date   Cancer ([place])    GERD (gastroesophageal reflux disease)    High cholesterol    Hypertension    Thyroid disease     PSH:  Past Surgical History:  Procedure Laterality Date   APPENDECTOMY     BACK SURGERY     CATARACT EXTRACTION     CHOLECYSTECTOMY     HERNIA REPAIR     NASAL SEPTUM SURGERY     NEPHRECTOMY Right    TONSILLECTOMY      Social History:  Social History   Socioeconomic History    Marital status: Widowed    Spouse name: Not on file   Number of children: Not on file   Years of education: Not on file   Highest education level: Not on file  Occupational History   Not on file  Tobacco Use   Smoking status: Never   Smokeless tobacco: Never  Substance and Sexual Activity   Alcohol use: No   Drug use: No   Sexual activity: Not on file  Other Topics Concern   Not on file  Social History Narrative   Not on file   Social Determinants of Health   Financial Resource Strain: Not on file  Food Insecurity: Not on file  Transportation Needs: Not on file  Physical Activity: Not on file  Stress: Not on file  Social Connections: Not on file  Intimate Partner Violence: Not on file    Family History: No family history on file.  Medications:   Current Outpatient Medications on File Prior to Visit  Medication Sig Dispense Refill   amLODipine (NORVASC) 10 MG tablet Take 10 mg by mouth daily.     aspirin EC 81 MG tablet Take 1 tablet (81 mg total) by mouth daily. Swallow whole. Take along with Plavix for 3 weeks, after 3 weeks-stop aspirin and just continue on Plavix. 21 tablet 0  clopidogrel (PLAVIX) 75 MG tablet Take 1 tablet (75 mg total) by mouth daily. 30 tablet 3   ezetimibe (ZETIA) 10 MG tablet Take 10 mg by mouth daily.     famotidine (PEPCID) 20 MG tablet Take 20 mg by mouth daily.     levothyroxine (SYNTHROID, LEVOTHROID) 75 MCG tablet Take 75 mcg by mouth daily before breakfast.     losartan (COZAAR) 100 MG tablet Take 100 mg by mouth daily.     metoprolol tartrate (LOPRESSOR) 25 MG tablet Take 25 mg by mouth 2 (two) times daily.     pantoprazole (PROTONIX) 40 MG tablet Take 1 tablet (40 mg total) by mouth daily. 30 tablet 1   rosuvastatin (CRESTOR) 20 MG tablet Take 1 tablet (20 mg total) by mouth daily. 30 tablet 3   No current facility-administered medications on file prior to visit.    Allergies:   Allergies  Allergen Reactions   Acrylic Polymer  [Carbomer]    Ceclor [Cefaclor]    Cephalosporins    Metronidazole    Morphine And Related    Other     Rubber   Penicillins    Septra [Sulfamethoxazole-Trimethoprim]    Tramadol       OBJECTIVE:  Physical Exam  There were no vitals filed for this visit. There is no height or weight on file to calculate BMI. No results found.      No data to display           General: well developed, well nourished, seated, in no evident distress Head: head normocephalic and atraumatic.   Neck: supple with no carotid or supraclavicular bruits Cardiovascular: regular rate and rhythm, no murmurs Musculoskeletal: no deformity Skin:  no rash/petichiae Vascular:  Normal pulses all extremities   Neurologic Exam Mental Status: Awake and fully alert. Oriented to place and time. Recent and remote memory intact. Attention span, concentration and fund of knowledge appropriate. Mood and affect appropriate.  Cranial Nerves: Fundoscopic exam reveals sharp disc margins. Pupils equal, briskly reactive to light. Extraocular movements full without nystagmus. Visual fields full to confrontation. Hearing intact. Facial sensation intact. Face, tongue, palate moves normally and symmetrically.  Motor: Normal bulk and tone. Normal strength in all tested extremity muscles Sensory.: intact to touch , pinprick , position and vibratory sensation.  Coordination: Rapid alternating movements normal in all extremities. Finger-to-nose and heel-to-shin performed accurately bilaterally. Gait and Station: Arises from chair without difficulty. Stance is normal. Gait demonstrates normal stride length and balance with ***. Tandem walk and heel toe ***.  Reflexes: 1+ and symmetric. Toes downgoing.     NIHSS  *** Modified Rankin  ***      ASSESSMENT: Gina Stewart is a 88 y.o. year old female with right brain TIA on 03/25/2022 likely due to small vessel disease. Vascular risk factors include HTN, HLD, asymptomatic  left carotid stenosis and advanced age.      PLAN:  TIA:  Residual deficit: ***.  Continue Plavix and rosuvastatin (Crestor) and Zetia '10mg'$  daily for secondary stroke prevention.   Discussed secondary stroke prevention measures and importance of close PCP follow up for aggressive stroke risk factor management including BP goal<130/90, HLD with LDL goal<70 and DM with A1c.<7 .  Stroke labs 03/2022: LDL 138, A1c 6.4 I have gone over the pathophysiology of stroke, warning signs and symptoms, risk factors and their management in some detail with instructions to go to the closest emergency room for symptoms of concern. Carotid stenosis: Followed by vascular surgery,  plans on repeat carotid duplex around 09/2022    Follow up in *** or call earlier if needed   CC:  [facility] provider: Dr. Leonie Man PCP: Derrill Center., MD    I spent *** minutes of face-to-face and non-face-to-face time with patient.  This included previsit chart review including review of recent hospitalization, lab review, study review, order entry, electronic health record documentation, patient education regarding recent stroke including etiology, secondary stroke prevention measures and importance of managing stroke risk factors, residual deficits and typical recovery time and answered all other questions to patient satisfaction   Frann Rider, AGNP-BC  [facility] [address] [place], [place] [address]  Phone [phone] Fax [phone] Note: This document was prepared with digital dictation and possible smart phrase technology. Any transcriptional errors that result from this process are unintentional.

## 2022-06-06 ENCOUNTER — Encounter: Payer: Self-pay | Admitting: Adult Health

## 2022-06-06 ENCOUNTER — Other Ambulatory Visit: Payer: Self-pay | Admitting: Adult Health

## 2022-06-06 ENCOUNTER — Ambulatory Visit (INDEPENDENT_AMBULATORY_CARE_PROVIDER_SITE_OTHER): Payer: Medicare (Managed Care) | Admitting: Adult Health

## 2022-06-06 VITALS — BP 138/60 | HR 57 | Ht 60.0 in | Wt 113.0 lb

## 2022-06-06 DIAGNOSIS — R7303 Prediabetes: Secondary | ICD-10-CM | POA: Diagnosis not present

## 2022-06-06 DIAGNOSIS — E785 Hyperlipidemia, unspecified: Secondary | ICD-10-CM | POA: Diagnosis not present

## 2022-06-06 DIAGNOSIS — G459 Transient cerebral ischemic attack, unspecified: Secondary | ICD-10-CM | POA: Diagnosis not present

## 2022-06-06 DIAGNOSIS — Z09 Encounter for follow-up examination after completed treatment for conditions other than malignant neoplasm: Secondary | ICD-10-CM

## 2022-06-06 MED ORDER — ASPIRIN 325 MG PO TBEC: 325.0000 mg | DELAYED_RELEASE_TABLET | Freq: Every day | ORAL | 5 refills | Status: DC

## 2022-06-06 NOTE — Patient Instructions (Addendum)
Start aspirin 325 mg daily, stop plavix,  and continue Crestor for secondary stroke prevention  We will check cholesterol and A1c levels today  Continue to follow with vascular surgery for left carotid stenosis (narrowing)  Continue to follow up with PCP regarding blood pressure and cholesterol management  Maintain strict control of hypertension with blood pressure goal below 130/90 and cholesterol with LDL cholesterol (bad cholesterol) goal below 70 mg/dL.   Signs of a Stroke? Follow the BEFAST method:  Balance Watch for a sudden loss of balance, trouble with coordination or vertigo Eyes Is there a sudden loss of vision in one or both eyes? Or double vision?  Face: Ask the person to smile. Does one side of the face droop or is it numb?  Arms: Ask the person to raise both arms. Does one arm drift downward? Is there weakness or numbness of a leg? Speech: Ask the person to repeat a simple phrase. Does the speech sound slurred/strange? Is the person confused ? Time: If you observe any of these signs, call 911.       Thank you for coming to see [place] at [facility]. I hope we have been able to provide you high quality care today.  You may receive a patient satisfaction survey over the next few weeks. We would appreciate your feedback and comments so that we may continue to improve ourselves and the health of our patients.    Transient Ischemic Attack A transient ischemic attack (TIA) happens when blood supply to the brain is blocked temporarily. A TIA causes stroke-like symptoms that go away quickly without causing any permanent damage. Having a TIA can be considered a warning sign for a stroke and should not be ignored. A person who has a TIA is at higher risk for a stroke. What are the causes? This condition is caused by a temporary blockage in an artery in the head or neck. This means the brain does not get the blood supply it needs. A blockage can be caused by: Fatty  buildup in an artery in the head or neck (atherosclerosis). A blood clot traveling from the heart. An artery tear (dissection). Inflammation of an artery (vasculitis). Sometimes the cause is not known. What increases the risk? Certain factors make you more likely to develop this condition. Some of these are things you can change, including: Using products that contain nicotine or tobacco. Being inactive. Heavy alcohol use. Drug use, especially cocaine and methamphetamine. Medical conditions that may increase your risk include: High blood pressure (hypertension). High cholesterol. Diabetes. Heart disease (coronary artery disease). An irregular heartbeat, also called atrial fibrillation (AFib). Sickle cell disease. Blood clotting disorders (hypercoagulable state). Other risk factors include: Being over the age of 44. Being female. Obesity. Sleep problems such as sleep apnea. Family history of stroke. Previous history of blood clots, stroke, TIA, or heart attack. What are the signs or symptoms? Symptoms of a TIA are the same as those of a stroke. The symptoms develop suddenly, and then go away quickly. They may include: Dizziness, loss of balance and coordination, or trouble walking. Vision changes, such as double vision, blurred vision, or loss of vision. Weakness or numbness in your face, arm, or leg, especially on one side of your body. Trouble speaking, understanding speech, or both (aphasia). Nausea and vomiting. Severe headache. Confusion. If possible, note what time your symptoms started. Tell your health care provider. How is this diagnosed? This condition may be diagnosed based on: Your symptoms and medical  history. A physical exam. Imaging tests, usually a CT scan or MRI of the brain. Blood tests. You may also have other tests, including: Electrocardiogram (ECG). Echocardiogram. Continuous heart monitoring. Carotid ultrasound. A scan of blood circulation in the  brain (CT angiogram or MR angiogram). How is this treated? The goal of treatment is to reduce the risk for a stroke. Stroke prevention therapies may include: Changes to diet and lifestyle, such as being physically active and stopping smoking. Treating other health conditions, such as diabetes or AFib. Medicines to thin the blood (antiplatelets or anticoagulants). Blood pressure medicines. Medicines to reduce cholesterol. If testing shows a narrowing in the arteries to your brain, your health care provider may recommend a procedure, such as: Carotid endarterectomy. This is done to remove the blockage from your artery. Carotid angioplasty and stenting. This uses a small mesh tube (stent) to open or widen an artery in the neck. The stent helps keep the artery open by supporting the artery walls. Follow these instructions at home: Medicines Take over-the-counter and prescription medicines only as told by your health care provider. If you were told to take a medicine to thin your blood, such as aspirin or an anticoagulant, use it exactly as told by your health care provider. Taking too much blood-thinning medicine can cause bleeding. Taking too little will not protect you against a stroke and other problems. Eating and drinking  Eat 5 or more servings of fruits and vegetables each day. Follow guidelines from your health care provider about your diet. You may need to follow a certain diet to help manage risk factors for stroke. This may include: Eating a low-fat, low-salt diet. Choosing high-fiber foods. Limiting carbohydrates and sugar. If you drink alcohol: Limit how much you have to: 0-1 drink a day for women who are not pregnant. 0-2 drinks a day for men. Know how much alcohol is in your drink. In the [place] one drink equals one 12 oz bottle of beer (355 mL), one 5 oz glass of wine (148 mL), or one 1 oz glass of hard liquor (44 mL). General instructions Maintain a healthy weight. Try to  get at least 30 minutes of exercise on most days. Get treatment if you have sleep apnea. Do not use any products that contain nicotine or tobacco. These products include cigarettes, chewing tobacco, and vaping devices, such as e-cigarettes. If you need help quitting, ask your health care provider. Do not use illegal drugs. Keep all follow-up visits. Your health care provider will want to know if you have any more symptoms and to check blood labs if any medicines were prescribed. Where to find more information American Stroke Association: stroke.org Get help right away if: You have chest pain. You have fast or irregular heartbeats (palpitations). You have any symptoms of a stroke. "BE FAST" is an easy way to remember the main warning signs of a stroke. B - Balance. Signs are dizziness, sudden trouble walking, or loss of balance. E - Eyes. Signs are trouble seeing or a sudden change in vision. F - Face. Signs are sudden weakness or numbness of the face, or the face or eyelid drooping on one side. A - Arms. Signs are weakness or numbness in an arm. This happens suddenly and usually on one side of the body. S - Speech.Signs are sudden trouble speaking, slurred speech, or trouble understanding what people say. T - Time. Time to call emergency services. Write down what time symptoms started. You have other signs of a  stroke, such as: A sudden, severe headache with no known cause. Nausea or vomiting. Seizure. These symptoms may be an emergency. Get help right away. Call 911. Do not wait to see if the symptoms will go away. Do not drive yourself to the hospital. This information is not intended to replace advice given to you by your health care provider. Make sure you discuss any questions you have with your health care provider. Document Revised: 12/22/2021 Document Reviewed: 12/22/2021 Elsevier Patient Education  [employer].

## 2022-06-07 ENCOUNTER — Telehealth: Payer: Self-pay | Admitting: Neurology

## 2022-06-07 LAB — LIPID PANEL
Chol/HDL Ratio: 2.1 ratio (ref 0.0–4.4)
Cholesterol, Total: 176 mg/dL (ref 100–199)
HDL: 85 mg/dL (ref >39)
LDL Chol Calc (NIH): 76 mg/dL (ref 0–99)
Triglycerides: 79 mg/dL (ref 0–149)
VLDL Cholesterol Cal: 15 mg/dL (ref 5–40)

## 2022-06-07 LAB — HEMOGLOBIN A1C
Est. average glucose Bld gHb Est-mCnc: 143 mg/dL
Hgb A1c MFr Bld: 6.6 % — ABNORMAL HIGH (ref 4.8–5.6)

## 2022-06-07 NOTE — Telephone Encounter (Signed)
-----   Message from Frann Rider, NP sent at 06/07/2022  7:13 AM EST ----- Patient requested to call regarding results that she does not use MyChart.  Please advise patient that her LDL or bad cholesterol significantly improved previously at 138 now at 76 with goal less than 70.  Please continue current dose of Crestor.  Would highly encourage continued diet modification as discussed during visit and to avoid high cholesterol foods.  A1c slightly increased from 6.4 to 6.6. would stress the importance of limiting carbohydrates and to f/u with PCP to further discuss increased level.   Thank you!

## 2022-06-07 NOTE — Telephone Encounter (Signed)
Called the patient and reviewed the lab results with her. Discussed her cholesterol levels and encouraged her to continue diet modifications and her medication. Pt verbalized understanding. Advised of the slight increase in A1C and she also advised she would continue to keep eye on that. She did state she had not been fasting prior to labs being drawn. She was appreciative for the call and had no other questions.

## 2022-07-26 ENCOUNTER — Other Ambulatory Visit: Payer: Self-pay

## 2022-07-26 ENCOUNTER — Encounter (HOSPITAL_BASED_OUTPATIENT_CLINIC_OR_DEPARTMENT_OTHER): Payer: Self-pay

## 2022-07-26 ENCOUNTER — Emergency Department (HOSPITAL_BASED_OUTPATIENT_CLINIC_OR_DEPARTMENT_OTHER)
Admission: EM | Admit: 2022-07-26 | Discharge: 2022-07-27 | Disposition: A | Discharge status: Home / Self Care | Payer: Medicare HMO | Attending: Emergency Medicine | Admitting: Emergency Medicine

## 2022-07-26 DIAGNOSIS — I10 Essential (primary) hypertension: Secondary | ICD-10-CM | POA: Diagnosis not present

## 2022-07-26 DIAGNOSIS — R42 Dizziness and giddiness: Secondary | ICD-10-CM

## 2022-07-26 DIAGNOSIS — Z79899 Other long term (current) drug therapy: Secondary | ICD-10-CM | POA: Diagnosis not present

## 2022-07-26 DIAGNOSIS — E875 Hyperkalemia: Secondary | ICD-10-CM | POA: Insufficient documentation

## 2022-07-26 DIAGNOSIS — Z7982 Long term (current) use of aspirin: Secondary | ICD-10-CM | POA: Insufficient documentation

## 2022-07-26 DIAGNOSIS — R739 Hyperglycemia, unspecified: Secondary | ICD-10-CM | POA: Insufficient documentation

## 2022-07-26 DIAGNOSIS — E860 Dehydration: Secondary | ICD-10-CM | POA: Diagnosis not present

## 2022-07-26 LAB — CBC
HCT: 36.1 % (ref 36.0–46.0)
Hemoglobin: 12 g/dL (ref 12.0–15.0)
MCH: 33 pg (ref 26.0–34.0)
MCHC: 33.2 g/dL (ref 30.0–36.0)
MCV: 99.2 fL (ref 80.0–100.0)
Platelets: 378 10*3/uL (ref 150–400)
RBC: 3.64 MIL/uL — ABNORMAL LOW (ref 3.87–5.11)
RDW: 13.3 % (ref 11.5–15.5)
WBC: 7.3 10*3/uL (ref 4.0–10.5)
nRBC: 0 % (ref 0.0–0.2)

## 2022-07-26 LAB — URINALYSIS, MICROSCOPIC (REFLEX)

## 2022-07-26 LAB — URINALYSIS, ROUTINE W REFLEX MICROSCOPIC
Bilirubin Urine: NEGATIVE
Glucose, UA: >=500 mg/dL — AB
Hgb urine dipstick: NEGATIVE
Ketones, ur: NEGATIVE mg/dL
Leukocytes,Ua: NEGATIVE
Nitrite: NEGATIVE
Protein, ur: NEGATIVE mg/dL
Specific Gravity, Urine: 1.015 (ref 1.005–1.030)
pH: 7 (ref 5.0–8.0)

## 2022-07-26 LAB — BASIC METABOLIC PANEL WITH GFR
Anion gap: 10 (ref 5–15)
BUN: 33 mg/dL — ABNORMAL HIGH (ref 8–23)
CO2: 23 mmol/L (ref 22–32)
Calcium: 9 mg/dL (ref 8.9–10.3)
Chloride: 101 mmol/L (ref 98–111)
Creatinine, Ser: 1.19 mg/dL — ABNORMAL HIGH (ref 0.44–1.00)
GFR, Estimated: 43 mL/min — ABNORMAL LOW
Glucose, Bld: 280 mg/dL — ABNORMAL HIGH (ref 70–99)
Potassium: 5.4 mmol/L — ABNORMAL HIGH (ref 3.5–5.1)
Sodium: 134 mmol/L — ABNORMAL LOW (ref 135–145)

## 2022-07-26 LAB — CBG MONITORING, ED: Glucose-Capillary: 246 mg/dL — ABNORMAL HIGH (ref 70–99)

## 2022-07-26 MED ORDER — SODIUM CHLORIDE 0.9 % IV BOLUS
500.0000 mL | Freq: Once | INTRAVENOUS | Status: AC
  Administered 2022-07-26: 500 mL via INTRAVENOUS

## 2022-07-26 NOTE — ED Notes (Signed)
Patient ambulated to wheelchair and to the bathroom. Patient became quite dizzy and light headed during same.

## 2022-07-26 NOTE — ED Triage Notes (Signed)
Pt reports sudden onset dizziness that began 1 hour ago. Pt states this has happened before due to low sodium levels. Pt seen today at ortho for bilateral knee pain and received bilateral steroid injections to knee. Pt denies fevers, N/V/D. No other complaints. Pt unsteady gait from wheelchair to stretcher.

## 2022-07-27 DIAGNOSIS — E860 Dehydration: Secondary | ICD-10-CM | POA: Diagnosis not present

## 2022-07-27 LAB — BASIC METABOLIC PANEL
Anion gap: 8 (ref 5–15)
BUN: 31 mg/dL — ABNORMAL HIGH (ref 8–23)
CO2: 22 mmol/L (ref 22–32)
Calcium: 8.3 mg/dL — ABNORMAL LOW (ref 8.9–10.3)
Chloride: 105 mmol/L (ref 98–111)
Creatinine, Ser: 1.09 mg/dL — ABNORMAL HIGH (ref 0.44–1.00)
GFR, Estimated: 48 mL/min — ABNORMAL LOW (ref >60)
Glucose, Bld: 222 mg/dL — ABNORMAL HIGH (ref 70–99)
Potassium: 5.3 mmol/L — ABNORMAL HIGH (ref 3.5–5.1)
Sodium: 135 mmol/L (ref 135–145)

## 2022-07-27 MED ORDER — SODIUM CHLORIDE 0.9 % IV BOLUS
500.0000 mL | Freq: Once | INTRAVENOUS | Status: AC
  Administered 2022-07-27: 500 mL via INTRAVENOUS

## 2022-07-27 MED ORDER — SODIUM ZIRCONIUM CYCLOSILICATE 5 G PO PACK
5.0000 g | PACK | Freq: Once | ORAL | Status: AC
  Administered 2022-07-27: 5 g via ORAL
  Filled 2022-07-27: qty 1

## 2022-07-27 NOTE — Discharge Instructions (Addendum)
Please stop the Aldactone/spironolactone  You can start taking your [facility] as scheduled. You can continue Cozaar/losartan for now  Please see your primary doctor on Monday January 8 for recheck of your sodium, potassium and kidney function

## 2022-07-27 NOTE — ED Notes (Signed)
Ambulated pt to bathroom. Pt report she feels better, but still has some mild dizziness. Reports she mostly feels pain in knees from cortisone shots. Provider was with pt while ambulating.

## 2022-07-27 NOTE — ED Provider Notes (Signed)
[facility] EMERGENCY DEPARTMENT Provider Note   CSN: [id number] Arrival date & time: 07/26/22  2256     History  Chief Complaint  Patient presents with   Dizziness    Gina Stewart is a 89 y.o. female.   Dizziness Patient with history of hypertension, hyperlipidemia presents with dizziness.  Patient reports approximately 2 hours ago she was watching television when she started having onset of dizziness/vertigo.  She has had this previously when she has had low sodium.  No syncope.  No headache/fever/cough/shortness of breath.  No chest pain.  No new visual or hearing changes.  No focal weakness.  No new medications.  She reports she did have bilateral knee steroid injections earlier in the day, but has never had reactions from this previously    Past Medical History:  Diagnosis Date   Cancer ([place])    GERD (gastroesophageal reflux disease)    High cholesterol    Hypertension    Thyroid disease     Home Medications Prior to Admission medications   Medication Sig Start Date End Date Taking? Authorizing Provider  amLODipine (NORVASC) 10 MG tablet Take 10 mg by mouth daily.   Yes [provider]  aspirin EC 325 MG tablet Take 1 tablet (325 mg total) by mouth daily. 06/06/22  Yes McCue, Janett Billow, NP  azelastine (ASTELIN) 0.1 % nasal spray Place 2 sprays into both nostrils as needed for rhinitis. 03/13/22  Yes [provider]  Biotin 10 MG TABS Take 10 mg by mouth daily. 06/27/11  Yes [provider]  Cholecalciferol (VITAMIN D-1000 MAX ST) 25 MCG (1000 UT) tablet Take 1,000 Units by mouth daily.   Yes [provider]  famotidine (PEPCID) 20 MG tablet Take 20 mg by mouth daily.   Yes [provider]  gabapentin (NEURONTIN) 100 MG capsule Take 1-2 capsules twice daily. 01/10/17  Yes [provider]  HYDROcodone-acetaminophen (NORCO) 10-325 MG tablet Take 1 tablet by mouth every 6 (six) hours as needed for severe pain.    Yes [provider]  ipratropium (ATROVENT) 0.06 % nasal spray Place 1 spray into both nostrils as needed. 06/08/22  Yes [provider]  levothyroxine (SYNTHROID, LEVOTHROID) 75 MCG tablet Take 75 mcg by mouth daily before breakfast.   Yes [provider]  LOKELMA 10 g PACK packet SMARTSIG:1 Packet(s) By Mouth 3 Times a Week   Yes [provider]  losartan (COZAAR) 100 MG tablet Take 100 mg by mouth daily.   Yes [provider]  Magnesium Gluconate 550 MG TABS Take 30 mg by mouth at bedtime.   Yes [provider]  metoprolol tartrate (LOPRESSOR) 25 MG tablet Take 25 mg by mouth 2 (two) times daily.   Yes [provider]  REPATHA SURECLICK 542 MG/ML SOAJ Inject 140 mg into the skin every 14 (fourteen) days. 06/12/22  Yes [provider]  Specialty Vitamins Products (ICAPS LUTEIN & ZEAXANTHIN PO) Take 1 tablet by mouth in the morning and at bedtime.   Yes [provider]  triamcinolone acetonide (KENALOG-40) 40 MG/ML injection Inject 40 mg into the articular space once. Given at clinic 07/26/22  Yes [provider]  rosuvastatin (CRESTOR) 20 MG tablet Take 1 tablet (20 mg total) by mouth daily. 03/27/22   Ghimire, Henreitta Leber, MD      Allergies    Acrylic polymer [carbomer], Atorvastatin calcium, Ceclor [cefaclor], Cephalosporins, Metronidazole, Morphine and related, Other, Penicillins, Septra [sulfamethoxazole-trimethoprim], and Tramadol    Review  of Systems   Review of Systems  Neurological:  Positive for dizziness.    Physical Exam Updated Vital Signs BP (!) 150/68   Pulse 80   Temp 97.8 F (36.6 C) (Oral)   Resp 12   Ht 1.524 m (5')   Wt 49.4 kg   SpO2 100%   BMI 21.29 kg/m  Physical Exam CONSTITUTIONAL: Elderly but appears younger than stated age HEAD: Normocephalic/atraumatic EYES: EOMI/PERRL, no nystagmus, no ptosis ENMT: Mucous membranes moist NECK: supple no meningeal signs, no  bruits CV: S1/S2 noted, soft murmur noted, no rubs or gallops LUNGS: Lungs are clear to auscultation bilaterally, no apparent distress ABDOMEN: soft, nontender, no rebound or guarding GU:no cva tenderness NEURO:Awake/alert, face symmetric, no arm or leg drift is noted Equal 5/5 strength with shoulder abduction, elbow flex/extension, wrist flex/extension in upper extremities and equal hand grips bilaterally Equal 5/5 strength with hip flexion,knee flex/extension, foot dorsi/plantar flexion Cranial nerves 3/4/5/6/01/28/09/11/12 tested and intact No past pointing Sensation to light touch intact in all extremities EXTREMITIES: pulses normal, full ROM, bandages noted to both knees.  No erythema, no effusion. SKIN: warm, color normal PSYCH: no abnormalities of mood noted  ED Results / Procedures / Treatments   Labs (all labs ordered are listed, but only abnormal results are displayed) Labs Reviewed  BASIC METABOLIC PANEL - Abnormal; Notable for the following components:      Result Value   Sodium 134 (*)    Potassium 5.4 (*)    Glucose, Bld 280 (*)    BUN 33 (*)    Creatinine, Ser 1.19 (*)    GFR, Estimated 43 (*)    All other components within normal limits  CBC - Abnormal; Notable for the following components:   RBC 3.64 (*)    All other components within normal limits  URINALYSIS, ROUTINE W REFLEX MICROSCOPIC - Abnormal; Notable for the following components:   Glucose, UA >=500 (*)    All other components within normal limits  URINALYSIS, MICROSCOPIC (REFLEX) - Abnormal; Notable for the following components:   Bacteria, UA RARE (*)    All other components within normal limits  BASIC METABOLIC PANEL - Abnormal; Notable for the following components:   Potassium 5.3 (*)    Glucose, Bld 222 (*)    BUN 31 (*)    Creatinine, Ser 1.09 (*)    Calcium 8.3 (*)    GFR, Estimated 48 (*)    All other components within normal limits  CBG MONITORING, ED - Abnormal; Notable for the following  components:   Glucose-Capillary 246 (*)    All other components within normal limits    EKG EKG Interpretation  Date/Time:  Thursday July 26 2022 23:09:50 EST Ventricular Rate:  78 PR Interval:  147 QRS Duration: 82 QT Interval:  392 QTC Calculation: 447 R Axis:   57 Text Interpretation: Sinus rhythm Confirmed by Ripley Fraise [phone]) on 07/26/2022 11:25:59 PM  Radiology No results found.  Procedures Procedures    Medications Ordered in ED Medications  sodium zirconium cyclosilicate (LOKELMA) packet 5 g (has no administration in time range)  sodium chloride 0.9 % bolus 500 mL (0 mLs Intravenous Stopped 07/27/22 0029)  sodium chloride 0.9 % bolus 500 mL (0 mLs Intravenous Stopped 07/27/22 0108)    ED Course/ Medical Decision Making/ A&P Clinical Course as of 07/27/22 0217  Thu Jul 26, 2022  2359 Glucose(!): 280 Hyperglycemia [DW]  2359 BUN(!): 33 Dehydration noted [DW]  2359 Potassium(!): 5.4 Mild hyperkalemia [DW]  Fri Jul 27, 2022  0005 Patient presents with dizziness, felt like she had this previously with low sodium labs reveal dehydration that appears new.  She has had extensive workup previously for this including MRI, CT angio which did not reveal any large vessel obstruction back in September  She has also had echocardiograms.  Will give fluids and reassess [DW]  0032 Patient reports feeling improved.  She reports her glucose is not usually this elevated.  She does report eating chocolate just prior to arrival [DW]  0216 Overall patient is improved.  She is able to ambulate with a slow steady gait, but does have knee pain.  She did not have any ataxia or fall.  She seemed to have improved with IV fluids [DW]  0216 Labs revealed mild hyperkalemia.  Patient reports she has had this previously and is on [place].  We reconciled her med list and it appears that she is on both Cozaar and Aldactone.  Both these meds will increase her potassium.  Plan for her to stop the  Aldactone immediately as she reports this is the newest medication.  She will be given a one-time dose of Lokelma here.  She will go back to her regularly scheduled Lokelma at home.  She will follow with PCP for electrolyte recheck.  Patient reports that she has had dizziness previously with hyperkalemia. [DW]  0217 I suspect electrolyte imbalance was the cause of her symptoms tonight.  I have low suspicion for CVA at this time. [DW]    Clinical Course User Index [DW] Ripley Fraise, MD                           Medical Decision Making Amount and/or Complexity of Data Reviewed Labs: ordered. Decision-making details documented in ED Course.  Risk Prescription drug management.   This patient presents to the ED for concern of weakness and dizziness, this involves an extensive number of treatment options, and is a complaint that carries with it a high risk of complications and morbidity.  The differential diagnosis includes but is not limited to CVA, intracranial hemorrhage, acute coronary syndrome, renal failure, urinary tract infection, electrolyte disturbance, pneumonia    Comorbidities that complicate the patient evaluation: Patient's presentation is complicated by their history of hypertension  Social Determinants of Health: Patient's  frailty   increases the complexity of managing their presentation  Additional history obtained: Records reviewed Care Everywhere/External Records  Lab Tests: I Ordered, and personally interpreted labs.  The pertinent results include: Dehydration, hyperglycemia  Cardiac Monitoring: The patient was maintained on a cardiac monitor.  I personally viewed and interpreted the cardiac monitor which showed an underlying rhythm of:  sinus rhythm  Medicines ordered and prescription drug management: I ordered medication including IV fluids for dehydration Reevaluation of the patient after these medicines showed that the patient    improved  Critical  Interventions:  IV fluids   Reevaluation: After the interventions noted above, I reevaluated the patient and found that they have :improved  Complexity of problems addressed: Patient's presentation is most consistent with  acute presentation with potential threat to life or bodily function  Disposition: After consideration of the diagnostic results and the patient's response to treatment,  I feel that the patent would benefit from discharge   .           Final Clinical Impression(s) / ED Diagnoses Final diagnoses:  Dizziness  Dehydration  Hyperglycemia  Hyperkalemia  Rx / DC Orders ED Discharge Orders     None         Ripley Fraise, MD 07/27/22 [phone]

## 2022-09-17 ENCOUNTER — Telehealth: Payer: Self-pay | Admitting: Neurology

## 2022-09-17 NOTE — Telephone Encounter (Signed)
Form completed, faxed back with last OVN and NP recommendations attached to [id number]. Confirmation received.

## 2022-09-17 NOTE — Telephone Encounter (Signed)
Patient with hx of TIA 03/2022. She is just about 6 months post stroke/TIA at this time. She was stable to prior visit on 05/2022 from a stroke standpoint. As long as she has not had any new or reoccurring stroke/TIA symptoms since, can proceed with surgical procedure as requested from stroke standpoint only, can hold aspirin for 3 to 5 days prior with small but acceptable risk of preprocedural stroke while off therapy and recommend restarting immediately after or once hemodynamically stable. She does have known carotid stenosis and would recommend further clearance/recommendations with vascular surgery prior to proceeding.   (Please attach this information with clearance form. Thank you!)

## 2022-09-17 NOTE — Telephone Encounter (Signed)
Received a form to be completed from [facility] for Right total knee replacement. Last office visit was with Sheliah Hatch 06/06/2022. I will have her review the form and states recommendation and sign if ok.

## 2022-09-26 NOTE — Telephone Encounter (Signed)
Received another form from same place needing a change to be made on the form. Janett Billow has signed the updated form and I faxed back with last ov. Received confirmation.

## 2022-11-06 NOTE — Progress Notes (Signed)
Surgery orders requested via Epic inbox. °

## 2022-11-07 ENCOUNTER — Emergency Department (HOSPITAL_COMMUNITY): Payer: Medicare HMO

## 2022-11-07 ENCOUNTER — Other Ambulatory Visit: Payer: Self-pay

## 2022-11-07 ENCOUNTER — Encounter (HOSPITAL_COMMUNITY): Payer: Self-pay | Admitting: Emergency Medicine

## 2022-11-07 ENCOUNTER — Emergency Department (HOSPITAL_COMMUNITY)
Admission: EM | Admit: 2022-11-07 | Discharge: 2022-11-07 | Disposition: A | Discharge status: Home / Self Care | Payer: Medicare HMO | Attending: Emergency Medicine | Admitting: Emergency Medicine

## 2022-11-07 DIAGNOSIS — I129 Hypertensive chronic kidney disease with stage 1 through stage 4 chronic kidney disease, or unspecified chronic kidney disease: Secondary | ICD-10-CM | POA: Diagnosis not present

## 2022-11-07 DIAGNOSIS — N189 Chronic kidney disease, unspecified: Secondary | ICD-10-CM | POA: Insufficient documentation

## 2022-11-07 DIAGNOSIS — Z8673 Personal history of transient ischemic attack (TIA), and cerebral infarction without residual deficits: Secondary | ICD-10-CM | POA: Diagnosis not present

## 2022-11-07 DIAGNOSIS — R4701 Aphasia: Secondary | ICD-10-CM | POA: Diagnosis present

## 2022-11-07 DIAGNOSIS — E876 Hypokalemia: Secondary | ICD-10-CM | POA: Diagnosis not present

## 2022-11-07 DIAGNOSIS — Z79899 Other long term (current) drug therapy: Secondary | ICD-10-CM | POA: Diagnosis not present

## 2022-11-07 DIAGNOSIS — G459 Transient cerebral ischemic attack, unspecified: Secondary | ICD-10-CM

## 2022-11-07 LAB — COMPREHENSIVE METABOLIC PANEL
ALT: 17 U/L (ref 0–44)
AST: 24 U/L (ref 15–41)
Albumin: 3.4 g/dL — ABNORMAL LOW (ref 3.5–5.0)
Alkaline Phosphatase: 62 U/L (ref 38–126)
Anion gap: 14 (ref 5–15)
BUN: 31 mg/dL — ABNORMAL HIGH (ref 8–23)
CO2: 25 mmol/L (ref 22–32)
Calcium: 9.1 mg/dL (ref 8.9–10.3)
Chloride: 97 mmol/L — ABNORMAL LOW (ref 98–111)
Creatinine, Ser: 1.33 mg/dL — ABNORMAL HIGH (ref 0.44–1.00)
GFR, Estimated: 38 mL/min — ABNORMAL LOW (ref >60)
Glucose, Bld: 232 mg/dL — ABNORMAL HIGH (ref 70–99)
Potassium: 3.4 mmol/L — ABNORMAL LOW (ref 3.5–5.1)
Sodium: 136 mmol/L (ref 135–145)
Total Bilirubin: 0.5 mg/dL (ref 0.3–1.2)
Total Protein: 6.1 g/dL — ABNORMAL LOW (ref 6.5–8.1)

## 2022-11-07 LAB — URINALYSIS, ROUTINE W REFLEX MICROSCOPIC
Bilirubin Urine: NEGATIVE
Glucose, UA: NEGATIVE mg/dL
Hgb urine dipstick: NEGATIVE
Ketones, ur: NEGATIVE mg/dL
Leukocytes,Ua: NEGATIVE
Nitrite: NEGATIVE
Protein, ur: NEGATIVE mg/dL
Specific Gravity, Urine: 1.005 (ref 1.005–1.030)
pH: 6 (ref 5.0–8.0)

## 2022-11-07 LAB — I-STAT CHEM 8, ED
BUN: 31 mg/dL — ABNORMAL HIGH (ref 8–23)
Calcium, Ion: 1.09 mmol/L — ABNORMAL LOW (ref 1.15–1.40)
Chloride: 99 mmol/L (ref 98–111)
Creatinine, Ser: 1.4 mg/dL — ABNORMAL HIGH (ref 0.44–1.00)
Glucose, Bld: 235 mg/dL — ABNORMAL HIGH (ref 70–99)
HCT: 35 % — ABNORMAL LOW (ref 36.0–46.0)
Hemoglobin: 11.9 g/dL — ABNORMAL LOW (ref 12.0–15.0)
Potassium: 3.3 mmol/L — ABNORMAL LOW (ref 3.5–5.1)
Sodium: 137 mmol/L (ref 135–145)
TCO2: 26 mmol/L (ref 22–32)

## 2022-11-07 LAB — CBC
HCT: 35.1 % — ABNORMAL LOW (ref 36.0–46.0)
Hemoglobin: 11.7 g/dL — ABNORMAL LOW (ref 12.0–15.0)
MCH: 32.3 pg (ref 26.0–34.0)
MCHC: 33.3 g/dL (ref 30.0–36.0)
MCV: 97 fL (ref 80.0–100.0)
Platelets: 246 10*3/uL (ref 150–400)
RBC: 3.62 MIL/uL — ABNORMAL LOW (ref 3.87–5.11)
RDW: 12.7 % (ref 11.5–15.5)
WBC: 9.4 10*3/uL (ref 4.0–10.5)
nRBC: 0 % (ref 0.0–0.2)

## 2022-11-07 LAB — DIFFERENTIAL
Abs Immature Granulocytes: 0.04 10*3/uL (ref 0.00–0.07)
Basophils Absolute: 0.1 10*3/uL (ref 0.0–0.1)
Basophils Relative: 1 %
Eosinophils Absolute: 0.2 10*3/uL (ref 0.0–0.5)
Eosinophils Relative: 2 %
Immature Granulocytes: 0 %
Lymphocytes Relative: 17 %
Lymphs Abs: 1.6 10*3/uL (ref 0.7–4.0)
Monocytes Absolute: 0.8 10*3/uL (ref 0.1–1.0)
Monocytes Relative: 8 %
Neutro Abs: 6.6 10*3/uL (ref 1.7–7.7)
Neutrophils Relative %: 72 %

## 2022-11-07 LAB — ETHANOL: Alcohol, Ethyl (B): <10 mg/dL (ref <10)

## 2022-11-07 LAB — APTT: aPTT: 25 seconds (ref 24–36)

## 2022-11-07 LAB — PROTIME-INR
INR: 1 (ref 0.8–1.2)
Prothrombin Time: 12.6 seconds (ref 11.4–15.2)

## 2022-11-07 MED ORDER — CLOPIDOGREL BISULFATE 75 MG PO TABS: 75.0000 mg | ORAL_TABLET | Freq: Every day | ORAL | 0 refills | Status: AC

## 2022-11-07 MED ORDER — LACTATED RINGERS IV BOLUS
500.0000 mL | Freq: Once | INTRAVENOUS | Status: AC
  Administered 2022-11-07: 500 mL via INTRAVENOUS

## 2022-11-07 MED ORDER — POTASSIUM CHLORIDE CRYS ER 20 MEQ PO TBCR
20.0000 meq | EXTENDED_RELEASE_TABLET | Freq: Once | ORAL | Status: AC
  Administered 2022-11-07: 20 meq via ORAL
  Filled 2022-11-07: qty 1

## 2022-11-07 MED ORDER — DIAZEPAM 5 MG/ML IJ SOLN
2.0000 mg | Freq: Once | INTRAMUSCULAR | Status: AC
  Administered 2022-11-07: 2 mg via INTRAVENOUS
  Filled 2022-11-07: qty 2

## 2022-11-07 MED ORDER — DIAZEPAM 5 MG/ML IJ SOLN
2.0000 mg | Freq: Once | INTRAMUSCULAR | Status: AC | PRN
  Administered 2022-11-07: 2 mg via INTRAVENOUS
  Filled 2022-11-07: qty 2

## 2022-11-07 NOTE — ED Triage Notes (Signed)
Per [facility] pt coming from [facility] with LKN 13:45. Patient reports "feeling like I;m out of this world" patients friend stated patient had slurred speech and a thick tongue. Patient states she does not feel back at her baseline. Patient A&0x4.

## 2022-11-07 NOTE — Consult Note (Addendum)
NEURO HOSPITALIST CONSULT NOTE   Requestig physician: Dr. Criss Alvine  Reason for Consult: Acute onset of slurred speech  History obtained from:  EMS, Patient and Chart     HPI:                                                                                                                                          Gina Stewart is an 89 y.o. female with a PMHx of cancer, GERD, TIA, hypercholesterolemia, CKD, HTN and thyroid disease who presents from her independent living facility via EMS with a chief complaint of "feeling like I'm out of this world". LKN 1345. The patient's friend stated that the patient had slurred speech and a thick tongue. Patient on arrival stated that she does not feel back at her baseline, but was A&0x4. Code Stroke was called in the ED when she was noted to be dysarthric.   Additional history per EDP note has been reviewed: "89 year old female with a history of hypertension, TIA, thyroid disease vertigo, osteoarthritis, CKD, hyperlipidemia, and other comorbidities presents with possible stroke.  Around 1:45 PM she all of a sudden had an abnormal feeling in her head that is hard to describe.  It was not dizziness.  She felt like she was having an out of body experience.  When she talked to the nurse at her independent living facility they noted some trouble speaking.  This was noticed when she arrived in the emergency department and so a code stroke was called.  She was having a hard time fully pronunciation certain words such as "tiptop".  I am seeing her after her initial code stroke visit and the CT scanner and she states that her speech is now completely back to normal.  She feels like all of her symptoms have resolved.  This feels similar to when she had a TIA back in September 2023.  She never had any vision complaints, headache, or focal weakness/numbness."   Past Medical History:  Diagnosis Date   Cancer    GERD (gastroesophageal reflux disease)     High cholesterol    Hypertension    Thyroid disease     Past Surgical History:  Procedure Laterality Date   APPENDECTOMY     BACK SURGERY     CATARACT EXTRACTION     CHOLECYSTECTOMY     HERNIA REPAIR     NASAL SEPTUM SURGERY     NEPHRECTOMY Right    TONSILLECTOMY      History reviewed. No pertinent family history.          Social History:  reports that she has never smoked. She has never used smokeless tobacco. She reports that she does not drink alcohol and does not use drugs.  Allergies  Allergen Reactions  Acrylic Polymer [Carbomer]    Atorvastatin Calcium Other (See Comments)    Myalgias    Ceclor [Cefaclor]    Cephalosporins    Metronidazole    Morphine And Related    Other     Rubber   Penicillins    Septra [Sulfamethoxazole-Trimethoprim]    Tramadol     HOME MEDICATIONS:                                                                                                                      No current facility-administered medications on file prior to encounter.   Current Outpatient Medications on File Prior to Encounter  Medication Sig Dispense Refill   amLODipine (NORVASC) 10 MG tablet Take 10 mg by mouth daily.     azelastine (ASTELIN) 0.1 % nasal spray Place 2 sprays into both nostrils as needed for rhinitis.     Biotin 10 MG TABS Take 10 mg by mouth daily.     Cholecalciferol (VITAMIN D-1000 MAX ST) 25 MCG (1000 UT) tablet Take 1,000 Units by mouth daily.     famotidine (PEPCID) 20 MG tablet Take 20 mg by mouth daily.     gabapentin (NEURONTIN) 100 MG capsule Take 1-2 capsules twice daily.     HYDROcodone-acetaminophen (NORCO) 10-325 MG tablet Take 1 tablet by mouth every 6 (six) hours as needed for severe pain.     ipratropium (ATROVENT) 0.06 % nasal spray Place 1 spray into both nostrils as needed.     levothyroxine (SYNTHROID, LEVOTHROID) 75 MCG tablet Take 75 mcg by mouth daily before breakfast.     LOKELMA 10 g PACK packet SMARTSIG:1 Packet(s) By  Mouth 3 Times a Week     losartan (COZAAR) 100 MG tablet Take 100 mg by mouth daily.     Magnesium Gluconate 550 MG TABS Take 30 mg by mouth at bedtime.     metoprolol tartrate (LOPRESSOR) 25 MG tablet Take 25 mg by mouth 2 (two) times daily.     REPATHA SURECLICK 140 MG/ML SOAJ Inject 140 mg into the skin every 14 (fourteen) days.     rosuvastatin (CRESTOR) 20 MG tablet Take 1 tablet (20 mg total) by mouth daily. 30 tablet 3   Specialty Vitamins Products (ICAPS LUTEIN & ZEAXANTHIN PO) Take 1 tablet by mouth in the morning and at bedtime.     triamcinolone acetonide (KENALOG-40) 40 MG/ML injection Inject 40 mg into the articular space once. Given at clinic       ROS:  As per HPI. At the time of neurology evaluation she has no additional complaints.    Blood pressure (!) 150/57, pulse 70, temperature 99.2 F (37.3 C), resp. rate 16, SpO2 96 %.   General Examination:                                                                                                       Physical Exam  HEENT-  [place]/AT    Lungs- Respirations unlabored Extremities- No edema   Neurological Examination Mental Status: Alert, oriented x 5, thought content appropriate.  Speech fluent without evidence of aphasia. Naming and comprehension intact. No dysarthria. Able to follow all commands without difficulty. Cranial Nerves: II: Visual fields intact bilaterally. No extinction to DSS.   III,IV, VI: No ptosis. EOMI. No nystagmus.  V: Temp sensation equal bilaterally VII: Smile symmetric VIII: Hearing intact to conversation IX,X: No hoarseness or hypophonia XI: Symmetric XII: Midline tongue extension Motor: Right : Upper extremity   5/5    Left:     Upper extremity   5/5  Lower extremity   5/5     Lower extremity   5/5 No pronator drift Sensory: Temp and light touch intact  throughout, bilaterally Deep Tendon Reflexes: 1+ bilateral brachioradialis. Deferred patellars due to pain.  Cerebellar: No ataxia with FNF bilaterally Gait: Deferred  NIHSS: 0   Lab Results: Basic Metabolic Panel: Recent Labs  Lab 11/07/22 1506 11/07/22 1508  NA 137 136  K 3.3* 3.4*  CL 99 97*  CO2  --  25  GLUCOSE 235* 232*  BUN 31* 31*  CREATININE 1.40* 1.33*  CALCIUM  --  9.1    CBC: Recent Labs  Lab 11/07/22 1506 11/07/22 1508  WBC  --  9.4  NEUTROABS  --  6.6  HGB 11.9* 11.7*  HCT 35.0* 35.1*  MCV  --  97.0  PLT  --  246    Cardiac Enzymes: No results for input(s): "CKTOTAL", "CKMB", "CKMBINDEX", "TROPONINI" in the last 168 hours.  Lipid Panel: No results for input(s): "CHOL", "TRIG", "HDL", "CHOLHDL", "VLDL", "LDLCALC" in the last 168 hours.  Imaging: CT HEAD CODE STROKE WO CONTRAST  Result Date: 11/07/2022 CLINICAL DATA:  Code stroke.  Slurred speech, off feeling EXAM: CT HEAD WITHOUT CONTRAST TECHNIQUE: Contiguous axial images were obtained from the base of the skull through the vertex without intravenous contrast. RADIATION DOSE REDUCTION: This exam was performed according to the departmental dose-optimization program which includes automated exposure control, adjustment of the mA and/or kV according to patient size and/or use of iterative reconstruction technique. COMPARISON:  07/31/2022 FINDINGS: Brain: No evidence of acute infarction, hemorrhage, mass, mass effect, or midline shift. No hydrocephalus or extra-axial collection. Periventricular white matter changes, likely the sequela of chronic small vessel ischemic disease. Vascular: No hyperdense vessel. Skull: Negative for fracture or focal lesion. Sinuses/Orbits: No acute finding. Status post bilateral lens replacements. Other: The mastoid air cells are well aerated. ASPECTS [facility] Stroke Program Early CT Score) - Ganglionic level infarction (caudate, lentiform nuclei, internal capsule, insula, M1-M3  cortex): 7 - Supraganglionic  infarction (M4-M6 cortex): 3 Total score (0-10 with 10 being normal): 10 IMPRESSION: 1. No acute intracranial process. 2. ASPECTS is 10. Imaging results were communicated on 11/07/2022 at 3:18 pm to provider Dr. Otelia Limes via secure text paging. Electronically Signed   By: Wiliam Ke M.D.   On: 11/07/2022 15:19     Assessment: - Follow up exam after CT reveals no focal deficits. NIHSS 0.  - CT head: No acute intracranial process. ASPECTS is 10 - DDx for presentation includes TIA. She had a complete stroke work up last fall.    Recommendations: - MRI brain. If positive will need admission for stroke work up and management. - Add Plavix to ASA.  - Please call Neurology after completion of MRI.    Electronically signed: Dr. Caryl Pina 11/07/2022, 5:54 PM

## 2022-11-07 NOTE — Discharge Instructions (Addendum)
Your presentation today is concerning for a TIA or transient stroke.  Due to this you are being prescribed Plavix which is an antiplatelet medicine that is stronger than aspirin.  Stop your aspirin while you are on this.  You need to follow-up with neurology and your primary care doctor.  Be sure to let your orthopedist know about the Plavix as this might affect your surgery.  If you develop fever, headache, trouble speaking, weakness or numbness, or any other new/concerning symptoms then return to the ER or call 911.

## 2022-11-07 NOTE — ED Provider Notes (Signed)
[place] [facility]ERGENCY DEPARTMENT AT [facility] Provider Note   CSN: [id number] Arrival date & time: 11/07/22  1505  An emergency department physician performed an initial assessment on this suspected stroke patient at 89.  History  Chief Complaint  Patient presents with   Aphasia    Gina Stewart is a 89 y.o. female.  HPI 89 year old female with a history of hypertension, TIA, thyroid disease vertigo, osteoarthritis, CKD, hyperlipidemia, and other comorbidities presents with possible stroke.  Around 1:45 PM she all of a sudden had an abnormal feeling in her head that is hard to describe.  It was not dizziness.  She felt like she was having an out of body experience.  When she talked to the nurse at her independent living facility they noted some trouble speaking.  This was noticed when she arrived in the emergency department and so a code stroke was called.  She was having a hard time fully pronunciation certain words such as "tiptop".  I am seeing her after her initial code stroke visit and the CT scanner and she states that her speech is now completely back to normal.  She feels like all of her symptoms have resolved.  This feels similar to when she had a TIA back in September 2023.  She never had any vision complaints, headache, or focal weakness/numbness.  Home Medications Prior to Admission medications   Medication Sig Start Date End Date Taking? Authorizing Provider  clopidogrel (PLAVIX) 75 MG tablet Take 1 tablet (75 mg total) by mouth daily. 11/07/22  Yes Pricilla Loveless, MD  amLODipine (NORVASC) 10 MG tablet Take 10 mg by mouth daily.    [provider]  azelastine (ASTELIN) 0.1 % nasal spray Place 2 sprays into both nostrils as needed for rhinitis. 03/13/22   [provider]  Biotin 10 MG TABS Take 10 mg by mouth daily. 06/27/11   [provider]  Cholecalciferol (VITAMIN D-1000 MAX ST) 25 MCG (1000 UT) tablet Take 1,000 Units by mouth  daily.    [provider]  famotidine (PEPCID) 20 MG tablet Take 20 mg by mouth daily.    [provider]  gabapentin (NEURONTIN) 100 MG capsule Take 1-2 capsules twice daily. 01/10/17   [provider]  HYDROcodone-acetaminophen (NORCO) 10-325 MG tablet Take 1 tablet by mouth every 6 (six) hours as needed for severe pain.    [provider]  ipratropium (ATROVENT) 0.06 % nasal spray Place 1 spray into both nostrils as needed. 06/08/22   [provider]  levothyroxine (SYNTHROID, LEVOTHROID) 75 MCG tablet Take 75 mcg by mouth daily before breakfast.    [provider]  LOKELMA 10 g PACK packet SMARTSIG:1 Packet(s) By Mouth 3 Times a Week    [provider]  losartan (COZAAR) 100 MG tablet Take 100 mg by mouth daily.    [provider]  Magnesium Gluconate 550 MG TABS Take 30 mg by mouth at bedtime.    [provider]  metoprolol tartrate (LOPRESSOR) 25 MG tablet Take 25 mg by mouth 2 (two) times daily.    [provider]  REPATHA SURECLICK 140 MG/ML SOAJ Inject 140 mg into the skin every 14 (fourteen) days. 06/12/22   [provider]  rosuvastatin (CRESTOR) 20 MG tablet Take 1 tablet (20 mg total) by mouth daily. 03/27/22   Ghimire, Werner Lean, MD  Specialty Vitamins Products (ICAPS LUTEIN & ZEAXANTHIN PO) Take 1 tablet by mouth in the morning and at  bedtime.    [provider]  triamcinolone acetonide (KENALOG-40) 40 MG/ML injection Inject 40 mg into the articular space once. Given at clinic 07/26/22   [provider]      Allergies    Acrylic polymer [carbomer], Atorvastatin calcium, Ceclor [cefaclor], Cephalosporins, Metronidazole, Morphine and related, Other, Penicillins, Septra [sulfamethoxazole-trimethoprim], and Tramadol    Review of Systems   Review of Systems  Constitutional:  Negative for fever.  Eyes:  Negative for visual disturbance.  Respiratory:  Negative for shortness  of breath.   Neurological:  Positive for speech difficulty and light-headedness. Negative for weakness, numbness and headaches.    Physical Exam Updated Vital Signs BP (!) 149/60 (BP Location: Right Arm)   Pulse 62   Temp 98 F (36.7 C) (Oral)   Resp 17   SpO2 97%  Physical Exam Vitals and nursing note reviewed.  Constitutional:      General: She is not in acute distress.    Appearance: She is well-developed. She is not ill-appearing or diaphoretic.  HENT:     Head: Normocephalic and atraumatic.  Eyes:     Extraocular Movements: Extraocular movements intact.     Pupils: Pupils are equal, round, and reactive to light.  Cardiovascular:     Rate and Rhythm: Normal rate and regular rhythm.     Heart sounds: Normal heart sounds.  Pulmonary:     Effort: Pulmonary effort is normal.     Breath sounds: Normal breath sounds.  Abdominal:     Palpations: Abdomen is soft.     Tenderness: There is no abdominal tenderness.  Skin:    General: Skin is warm and dry.  Neurological:     Mental Status: She is alert.     Comments: CN 3-12 grossly intact. 5/5 strength in all 4 extremities. Grossly normal sensation. Normal finger to nose. Clear speech.     ED Results / Procedures / Treatments   Labs (all labs ordered are listed, but only abnormal results are displayed) Labs Reviewed  CBC - Abnormal; Notable for the following components:      Result Value   RBC 3.62 (*)    Hemoglobin 11.7 (*)    HCT 35.1 (*)    All other components within normal limits  COMPREHENSIVE METABOLIC PANEL - Abnormal; Notable for the following components:   Potassium 3.4 (*)    Chloride 97 (*)    Glucose, Bld 232 (*)    BUN 31 (*)    Creatinine, Ser 1.33 (*)    Total Protein 6.1 (*)    Albumin 3.4 (*)    GFR, Estimated 38 (*)    All other components within normal limits  URINALYSIS, ROUTINE W REFLEX MICROSCOPIC - Abnormal; Notable for the following components:   Color, Urine STRAW (*)    All other  components within normal limits  I-STAT CHEM 8, ED - Abnormal; Notable for the following components:   Potassium 3.3 (*)    BUN 31 (*)    Creatinine, Ser 1.40 (*)    Glucose, Bld 235 (*)    Calcium, Ion 1.09 (*)    Hemoglobin 11.9 (*)    HCT 35.0 (*)    All other components within normal limits  ETHANOL  PROTIME-INR  APTT  DIFFERENTIAL  RAPID URINE DRUG SCREEN, HOSP PERFORMED    EKG EKG Interpretation  Date/Time:  Wednesday November 07 2022 15:33:05 EDT Ventricular Rate:  64 PR Interval:  146 QRS Duration: 80 QT Interval:  411 QTC Calculation:  424 R Axis:   47 Text Interpretation: Sinus rhythm no acute ST/T changes Confirmed by Pricilla Loveless [phone]) on 11/07/2022 4:35:22 PM  Radiology MR BRAIN WO CONTRAST  Result Date: 11/07/2022 CLINICAL DATA:  Transient ischemic attack EXAM: MRI HEAD WITHOUT CONTRAST TECHNIQUE: Multiplanar, multiecho pulse sequences of the brain and surrounding structures were obtained without intravenous contrast. COMPARISON:  03/26/2022 FINDINGS: Brain: No restricted diffusion to suggest acute or subacute infarct. No acute hemorrhage, mass, mass effect, or midline shift. No hydrocephalus or extra-axial collection. Normal pituitary and craniocervical junction. No hemosiderin deposition to suggest remote hemorrhage. T2 hyperintense signal in the periventricular white matter and pons, likely the sequela of moderate chronic small vessel ischemic disease. Cerebral volume is within normal limits for age. Vascular: Normal arterial flow voids. Skull and upper cervical spine: Normal marrow signal. Sinuses/Orbits: Clear paranasal sinuses. Status post bilateral lens replacements. Other: The mastoid air cells are well aerated. IMPRESSION: No acute intracranial process. No evidence of acute or subacute infarct. Electronically Signed   By: Wiliam Ke M.D.   On: 11/07/2022 19:13   CT HEAD CODE STROKE WO CONTRAST  Result Date: 11/07/2022 CLINICAL DATA:  Code stroke.  Slurred  speech, off feeling EXAM: CT HEAD WITHOUT CONTRAST TECHNIQUE: Contiguous axial images were obtained from the base of the skull through the vertex without intravenous contrast. RADIATION DOSE REDUCTION: This exam was performed according to the departmental dose-optimization program which includes automated exposure control, adjustment of the mA and/or kV according to patient size and/or use of iterative reconstruction technique. COMPARISON:  07/31/2022 FINDINGS: Brain: No evidence of acute infarction, hemorrhage, mass, mass effect, or midline shift. No hydrocephalus or extra-axial collection. Periventricular white matter changes, likely the sequela of chronic small vessel ischemic disease. Vascular: No hyperdense vessel. Skull: Negative for fracture or focal lesion. Sinuses/Orbits: No acute finding. Status post bilateral lens replacements. Other: The mastoid air cells are well aerated. ASPECTS [facility] Stroke Program Early CT Score) - Ganglionic level infarction (caudate, lentiform nuclei, internal capsule, insula, M1-M3 cortex): 7 - Supraganglionic infarction (M4-M6 cortex): 3 Total score (0-10 with 10 being normal): 10 IMPRESSION: 1. No acute intracranial process. 2. ASPECTS is 10. Imaging results were communicated on 11/07/2022 at 3:18 pm to provider Dr. Otelia Limes via secure text paging. Electronically Signed   By: Wiliam Ke M.D.   On: 11/07/2022 15:19    Procedures Procedures    Medications Ordered in ED Medications  lactated ringers bolus 500 mL (0 mLs Intravenous Stopped 11/07/22 1651)  potassium chloride SA (KLOR-CON M) CR tablet 20 mEq (20 mEq Oral Given 11/07/22 1655)  diazepam (VALIUM) injection 2 mg (2 mg Intravenous Given 11/07/22 1742)  diazepam (VALIUM) injection 2 mg (2 mg Intravenous Given 11/07/22 1819)    ED Course/ Medical Decision Making/ A&P Clinical Course as of 11/07/22 2240  Wed Nov 07, 2022  1705 Neurology, Dr. Otelia Limes, recommends MRI.  Given she just had a stroke workup she  could go home if this is negative.  However he would want her to be on maximal therapy which she would indicate would be 75 mg Plavix daily.  Can stop the aspirin. [SG]  1708 I discussed neurology recommendations with patient and she indicates that she used to be on Plavix but it was causing bruising and made her tired.  I discussed with neurology recommendations and she states she will think about it for now.  For now we will wait on MRI. [SG]    Clinical Course User Index [SG] Pricilla Loveless,  MD                             Medical Decision Making Amount and/or Complexity of Data Reviewed Labs:     Details: No significant electrolyte disturbance besides mild hypokalemia.  Hemoglobin with chronic anemia, unchanged. Radiology: ordered and independent interpretation performed.    Details: No head bleed. ECG/medicine tests: ordered and independent interpretation performed.    Details: Sinus rhythm.  Risk Prescription drug management.   Presents with possible TIA.  As above I discussed with neurology and given her resolution of symptoms they have decided not to use thrombolytics.  Given recent TIA workup a few months ago, they recommend Plavix assuming negative MRI.  MRI is unremarkable.  Discussed back-and-forth with patient, she is concerned because she he did have some extra bruising on the Plavix but we discussed that that will be something she will have to weigh for herself as far as stroke risk versus increased bruising.  If she goes on the Plavix she was instructed to stop the aspirin and let her orthopedist know as she is due for a surgery in a few weeks.  Otherwise, she would like to go home and appears stable for discharge home with return precautions.        Final Clinical Impression(s) / ED Diagnoses Final diagnoses:  TIA (transient ischemic attack)    Rx / DC Orders ED Discharge Orders          Ordered    clopidogrel (PLAVIX) 75 MG tablet  Daily        11/07/22 1943               Pricilla Loveless, MD 11/07/22 2241

## 2022-11-07 NOTE — ED Notes (Signed)
Patient verbalizes understanding of discharge instructions. Opportunity for questioning and answers were provided. Armband removed by staff, pt discharged from ED. Pt to return to independent living facility with family. Pt taken to ED entrance via wheelchair.

## 2022-11-07 NOTE — Code Documentation (Signed)
Stroke Response Nurse Documentation Code Documentation  Gina Stewart is a 89 y.o. female arriving to [facility]  via [facility] on 11/07/2022 with past medical hx of HTN, TIA, thyroid disease, vertigo, CKD, osteoarthritis, HLD. On aspirin 325 mg daily. Code stroke was activated by ED.   Patient from [facility] where she was LKW at 1345 and now complaining of slurred speech . Per EMS, patient friend stated that the patients speech sounded slurred/thick tongued and the patient stated she was 'feeling like I'm out of this world'. Told EMS she had a TIA in September of 2023 that this felt similar to.   Stroke team at the bedside on patient arrival. Labs drawn and patient cleared for CT by Dr. Adela Lank. Patient to CT with team. NIHSS 1, see documentation for details and code stroke times. Patient with dysarthria  on exam. The following imaging was completed:  CT Head and MRI. Patient is not a candidate for IV Thrombolytic due to symptoms too mild to treat. Patient is not not a candidate for IR due to no LVO noted on imaging.   Care Plan: q2hr VS/NIHSS; BP<220/120.   Bedside handoff with ED RN Elexes.    Felecia Jan  Stroke Response RN

## 2022-11-13 ENCOUNTER — Encounter (HOSPITAL_COMMUNITY): Payer: Self-pay

## 2022-11-13 ENCOUNTER — Encounter (HOSPITAL_COMMUNITY)
Admission: RE | Admit: 2022-11-13 | Discharge: 2022-11-13 | Disposition: A | Discharge status: Home / Self Care | Payer: 59 | Source: Ambulatory Visit | Attending: Family Medicine | Admitting: Family Medicine

## 2022-11-26 ENCOUNTER — Ambulatory Visit: Admit: 2022-11-26 | Payer: 59 | Admitting: Orthopedic Surgery

## 2022-11-26 SURGERY — ARTHROPLASTY, KNEE, TOTAL
Anesthesia: Spinal | Site: Knee | Laterality: Left

## 2022-12-18 ENCOUNTER — Ambulatory Visit: Payer: Medicare HMO | Admitting: Neurology

## 2022-12-18 ENCOUNTER — Encounter: Payer: Self-pay | Admitting: Neurology

## 2022-12-18 VITALS — BP 160/57 | HR 58 | Ht 59.0 in | Wt 111.2 lb

## 2022-12-18 DIAGNOSIS — I6523 Occlusion and stenosis of bilateral carotid arteries: Secondary | ICD-10-CM | POA: Diagnosis not present

## 2022-12-18 DIAGNOSIS — Z8673 Personal history of transient ischemic attack (TIA), and cerebral infarction without residual deficits: Secondary | ICD-10-CM

## 2022-12-18 NOTE — Progress Notes (Addendum)
[facility] [address] [place]. [place] [address] [phone]       OFFICE CONSULT NOTE  [name] Date of Birth:  [birth year] Medical Record Number:  [id number]   Referring MD: Sherlie Ban  Reason for Referral: Clearance for knee surgery  HPI: Gina Stewart is a pleasant 89 year old Caucasian lady seen today for initial office consultation visit.  History is obtained from the patient and her friend.  I personally reviewed pertinent available imaging films in PACS.  She has past medical history of hypertension, hyperlipidemia, gastroesophageal reflux disease, cancer and thyroid disease.  She presented on 11/07/2022 from her independent living facility via EMS with a chief complaint of feeling like I am out of the body.  She stated that her friend later told her that she had some slurred speech and I think.  Patient was noted to be dysarthric in the emergency room.  Describes abnormal feeling in her head is difficult to describe.  It does not like dizziness.  Does like she felt she was having out of body experience.  Like speaking to the nurse he noted some trouble sleeping.  CT scan of the head was unremarkable MRI scan was also negative for acute stroke.  Previous stroke workup on 04/04/2022 showed CT angiogram of 65% left ICA stenosis.CT angiogram showed 65% proximal left ICA stenosis with changes of beading suggestive of fibromuscular dysplasia.  60% stenosis of left subclavian origin.  Echocardiogram on 03/26/2022 showed ejection fraction of 60 to 65%.  Hemoglobin A1c on 08/10/2022 was 6.1%.  Patient states symptoms well since then without recurrent TIA or stroke symptoms.  She is tolerating Plavix well without bruising or bleeding.  She is on Repatha injections for hyperlipidemia and cholesterol and tolerating them well.  Patient has severe left knee pain and knee replacement surgery and is cleared for neurological clearance for the same.  ROS:   14 system review  of systems is positive for dizziness, tiredness, speech difficulties, tics, tingling numbness and all other systems negative  PMH:  Past Medical History:  Diagnosis Date   Cancer (HCC)    GERD (gastroesophageal reflux disease)    High cholesterol    Hypertension    Thyroid disease     Social History:  Social History   Socioeconomic History   Marital status: Widowed    Spouse name: Not on file   Number of children: Not on file   Years of education: Not on file   Highest education level: Not on file  Occupational History   Not on file  Tobacco Use   Smoking status: Never   Smokeless tobacco: Never  Substance and Sexual Activity   Alcohol use: No   Drug use: No   Sexual activity: Not on file  Other Topics Concern   Not on file  Social History Narrative   Not on file   Social Determinants of Health   Financial Resource Strain: Not on file  Food Insecurity: Not on file  Transportation Needs: Not on file  Physical Activity: Not on file  Stress: Not on file  Social Connections: Not on file  Intimate Partner Violence: Not on file    Medications:   Current Outpatient Medications on File Prior to Visit  Medication Sig Dispense Refill   amLODipine (NORVASC) 10 MG tablet Take 10 mg by mouth daily.     Biotin 10 MG TABS Take 10 mg by mouth daily.     Cholecalciferol (VITAMIN D-1000 MAX ST) 25 MCG (  1000 UT) tablet Take 1,000 Units by mouth daily.     clopidogrel (PLAVIX) 75 MG tablet Take 1 tablet (75 mg total) by mouth daily. 30 tablet 0   famotidine (PEPCID) 20 MG tablet Take 20 mg by mouth daily.     gabapentin (NEURONTIN) 100 MG capsule Take 1-2 capsules twice daily.     HYDROcodone-acetaminophen (NORCO) 10-325 MG tablet Take 1 tablet by mouth every 6 (six) hours as needed for severe pain.     ipratropium (ATROVENT) 0.06 % nasal spray Place 1 spray into both nostrils as needed.     levothyroxine (SYNTHROID, LEVOTHROID) 75 MCG tablet Take 75 mcg by mouth daily before  breakfast.     LOKELMA 10 g PACK packet SMARTSIG:1 Packet(s) By Mouth 3 Times a Week     losartan (COZAAR) 100 MG tablet Take 100 mg by mouth daily.     Magnesium Gluconate 550 MG TABS Take 30 mg by mouth at bedtime.     metoprolol tartrate (LOPRESSOR) 25 MG tablet Take 25 mg by mouth 2 (two) times daily.     REPATHA SURECLICK 140 MG/ML SOAJ Inject 140 mg into the skin every 14 (fourteen) days.     rosuvastatin (CRESTOR) 20 MG tablet Take 1 tablet (20 mg total) by mouth daily. 30 tablet 3   Specialty Vitamins Products (ICAPS LUTEIN & ZEAXANTHIN PO) Take 1 tablet by mouth in the morning and at bedtime.     triamcinolone acetonide (KENALOG-40) 40 MG/ML injection Inject 40 mg into the articular space once. Given at clinic     No current facility-administered medications on file prior to visit.    Allergies:   Allergies  Allergen Reactions   Acrylic Polymer [Carbomer]    Atorvastatin Calcium Other (See Comments)    Myalgias    Ceclor [Cefaclor]    Cephalosporins    Metronidazole    Morphine And Codeine    Other     Rubber   Penicillins    Septra [Sulfamethoxazole-Trimethoprim]    Tramadol     Physical Exam General: well developed, well nourished, seated, in no evident distress Head: head normocephalic and atraumatic.   Neck: supple with no carotid or supraclavicular bruits Cardiovascular: regular rate and rhythm, no murmurs Musculoskeletal: no deformity Skin:  no rash but scattered petichiae Vascular:  Normal pulses all extremities  Neurologic Exam Mental Status: Awake and fully alert. Oriented to place and time. Recent and remote memory intact. Attention span, concentration and fund of knowledge appropriate. Mood and affect appropriate.  Cranial Nerves: Fundoscopic exam reveals sharp disc margins. Pupils equal, briskly reactive to light. Extraocular movements full without nystagmus. Visual fields full to confrontation. Hearing diminished bilaterally. Facial sensation intact.  Face, tongue, palate moves normally and symmetrically.  Motor: Normal bulk and tone. Normal strength in all tested extremity muscles. Sensory.: intact to touch , pinprick , position and vibratory sensation.  Coordination: Rapid alternating movements normal in all extremities. Finger-to-nose and heel-to-shin performed accurately bilaterally. Gait and Station: Arises from chair without difficulty. Stance is normal. Gait demonstrates normal stride length and balance . Able to heel, toe and tandem walk without difficulty.  Reflexes: 1+ and symmetric. Toes downgoing.   NIHSS  0 Modified Rankin  1   ASSESSMENT: 89 year old pleasant Caucasian lady with transient episode of speech difficulties in April 2024  possible TIA due to small vessel disease.  Vascular risk factors hypertension, hyperlipidemia , carotid and subclavian stenosis and age     PLAN:I had a long d/w patient  and her friend about her remote TIA, recent visit to the ER for episode of transient dizziness possibly related to dehydration, risk for recurrent stroke/TIAs, personally independently reviewed imaging studies and stroke evaluation results and answered questions.Continue aspirin 325 mg daily  for secondary stroke prevention and maintain strict control of hypertension with blood pressure goal below 130/90, diabetes with hemoglobin A1c goal below 6.5% and lipids with LDL cholesterol goal below 70 mg/dL. I also advised the patient to eat a healthy diet with plenty of whole grains, cereals, fruits and vegetables, exercise regularly and maintain ideal body weight .patient has disabling bilateral knee pain limiting her lifestyle and she is neurologically cleared to undergo knee replacement hold aspirin for 3 days prior to procedure with small but acceptable periprocedural risk of TIA/stroke if she is willing.  Continue conservative follow-up for her moderate carotid stenosis with appears quite stable.  Patient is cleared for knee surgery and  will need to hold Plavix 3 to 5 days prior to the procedure and resume it after procedure with a small but acceptable periprocedural risk of TIA/stroke if patient is willing.  Followup in the future with me only as needed.  Greater than 50% time during this 50-minute consultation visit was spent on counseling and coordination of care about TIA and discussion about TIA evaluation, prevention and treatment and answering questions  Delia Heady, MD Note: This document was prepared with digital dictation and possible smart phrase technology. Any transcriptional errors that result from this process are unintentional.

## 2022-12-18 NOTE — Patient Instructions (Signed)
I had a long d/w patient and her friend about her remote TIA, recent visit to the ER for episode of transient dizziness possibly related to dehydration, risk for recurrent stroke/TIAs, personally independently reviewed imaging studies and stroke evaluation results and answered questions.Continue aspirin 325 mg daily  for secondary stroke prevention and maintain strict control of hypertension with blood pressure goal below 130/90, diabetes with hemoglobin A1c goal below 6.5% and lipids with LDL cholesterol goal below 70 mg/dL. I also advised the patient to eat a healthy diet with plenty of whole grains, cereals, fruits and vegetables, exercise regularly and maintain ideal body weight .patient has disabling bilateral knee pain limiting her lifestyle and she is neurologically cleared to undergo knee replacement hold aspirin for 3 days prior to procedure with small but acceptable periprocedural risk of TIA/stroke if she is willing.  Continue conservative follow-up for her moderate carotid stenosis with appears quite stable.  Followup in the future with me only as needed.  Stroke Prevention Some medical conditions and behaviors can lead to a higher chance of having a stroke. You can help prevent a stroke by eating healthy, exercising, not smoking, and managing any medical conditions you have. Stroke is a leading cause of functional impairment. Primary prevention is particularly important because a majority of strokes are first-time events. Stroke changes the lives of not only those who experience a stroke but also their family and other caregivers. How can this condition affect me? A stroke is a medical emergency and should be treated right away. A stroke can lead to brain damage and can sometimes be life-threatening. If a person gets medical treatment right away, there is a better chance of surviving and recovering from a stroke. What can increase my risk? The following medical conditions may increase your risk  of a stroke: Cardiovascular disease. High blood pressure (hypertension). Diabetes. High cholesterol. Sickle cell disease. Blood clotting disorders (hypercoagulable state). Obesity. Sleep disorders (obstructive sleep apnea). Other risk factors include: Being older than age 73. Having a history of blood clots, stroke, or mini-stroke (transient ischemic attack, TIA). Genetic factors, such as race, ethnicity, or a family history of stroke. Smoking cigarettes or using other tobacco products. Taking birth control pills, especially if you also use tobacco. Heavy use of alcohol or drugs, especially cocaine and methamphetamine. Physical inactivity. What actions can I take to prevent this? Manage your health conditions High cholesterol levels. Eating a healthy diet is important for preventing high cholesterol. If cholesterol cannot be managed through diet alone, you may need to take medicines. Take any prescribed medicines to control your cholesterol as told by your health care provider. Hypertension. To reduce your risk of stroke, try to keep your blood pressure below 130/80. Eating a healthy diet and exercising regularly are important for controlling blood pressure. If these steps are not enough to manage your blood pressure, you may need to take medicines. Take any prescribed medicines to control hypertension as told by your health care provider. Ask your health care provider if you should monitor your blood pressure at home. Have your blood pressure checked every year, even if your blood pressure is normal. Blood pressure increases with age and some medical conditions. Diabetes. Eating a healthy diet and exercising regularly are important parts of managing your blood sugar (glucose). If your blood sugar cannot be managed through diet and exercise, you may need to take medicines. Take any prescribed medicines to control your diabetes as told by your health care provider. Get evaluated for  obstructive sleep apnea. Talk to your health care provider about getting a sleep evaluation if you snore a lot or have excessive sleepiness. Make sure that any other medical conditions you have, such as atrial fibrillation or atherosclerosis, are managed. Nutrition Follow instructions from your health care provider about what to eat or drink to help manage your health condition. These instructions may include: Reducing your daily calorie intake. Limiting how much salt (sodium) you use to 1,500 milligrams (mg) each day. Using only healthy fats for cooking, such as olive oil, canola oil, or sunflower oil. Eating healthy foods. You can do this by: Choosing foods that are high in fiber, such as whole grains, and fresh fruits and vegetables. Eating at least 5 servings of fruits and vegetables a day. Try to fill one-half of your plate with fruits and vegetables at each meal. Choosing lean protein foods, such as lean cuts of meat, poultry without skin, fish, tofu, beans, and nuts. Eating low-fat dairy products. Avoiding foods that are high in sodium. This can help lower blood pressure. Avoiding foods that have saturated fat, trans fat, and cholesterol. This can help prevent high cholesterol. Avoiding processed and prepared foods. Counting your daily carbohydrate intake.  Lifestyle If you drink alcohol: Limit how much you have to: 0-1 drink a day for women who are not pregnant. 0-2 drinks a day for men. Know how much alcohol is in your drink. In the [place] one drink equals one 12 oz bottle of beer ( ), one 5 oz glass of wine ( ), or one 1 oz glass of hard liquor (44mL). Do not use any products that contain nicotine or tobacco. These products include cigarettes, chewing tobacco, and vaping devices, such as e-cigarettes. If you need help quitting, ask your health care provider. Avoid secondhand smoke. Do not use drugs. Activity  Try to stay at a healthy weight. Get at least 30 minutes of  exercise on most days, such as: Fast walking. Biking. Swimming. Medicines Take over-the-counter and prescription medicines only as told by your health care provider. Aspirin or blood thinners (antiplatelets or anticoagulants) may be recommended to reduce your risk of forming blood clots that can lead to stroke. Avoid taking birth control pills. Talk to your health care provider about the risks of taking birth control pills if: You are over 59 years old. You smoke. You get very bad headaches. You have had a blood clot. Where to find more information American Stroke Association: www.strokeassociation.org Get help right away if: You or a loved one has any symptoms of a stroke. "BE FAST" is an easy way to remember the main warning signs of a stroke: B - Balance. Signs are dizziness, sudden trouble walking, or loss of balance. E - Eyes. Signs are trouble seeing or a sudden change in vision. F - Face. Signs are sudden weakness or numbness of the face, or the face or eyelid drooping on one side. A - Arms. Signs are weakness or numbness in an arm. This happens suddenly and usually on one side of the body. S - Speech. Signs are sudden trouble speaking, slurred speech, or trouble understanding what people say. T - Time. Time to call emergency services. Write down what time symptoms started. You or a loved one has other signs of a stroke, such as: A sudden, severe headache with no known cause. Nausea or vomiting. Seizure. These symptoms may represent a serious problem that is an emergency. Do not wait to see if the symptoms will go away. Get  medical help right away. Call your local emergency services (911 in the [place] Do not drive yourself to the hospital. Summary You can help to prevent a stroke by eating healthy, exercising, not smoking, limiting alcohol intake, and managing any medical conditions you may have. Do not use any products that contain nicotine or tobacco. These include cigarettes,  chewing tobacco, and vaping devices, such as e-cigarettes. If you need help quitting, ask your health care provider. Remember "BE FAST" for warning signs of a stroke. Get help right away if you or a loved one has any of these signs. This information is not intended to replace advice given to you by your health care provider. Make sure you discuss any questions you have with your health care provider. Document Revised: 06/11/2022 Document Reviewed: 06/11/2022 Elsevier Patient Education  2024 [employer].

## 2022-12-19 LAB — LIPID PANEL
Chol/HDL Ratio: 2.4 ratio (ref 0.0–4.4)
Cholesterol, Total: 206 mg/dL — ABNORMAL HIGH (ref 100–199)
HDL: 86 mg/dL (ref >39)
LDL Chol Calc (NIH): 103 mg/dL — ABNORMAL HIGH (ref 0–99)
Triglycerides: 98 mg/dL (ref 0–149)
VLDL Cholesterol Cal: 17 mg/dL (ref 5–40)

## 2022-12-19 LAB — HEMOGLOBIN A1C
Est. average glucose Bld gHb Est-mCnc: 120 mg/dL
Hgb A1c MFr Bld: 5.8 % — ABNORMAL HIGH (ref 4.8–5.6)

## 2022-12-19 NOTE — Progress Notes (Signed)
Kindly inform the patient that screening test for diabetes was satisfactory.  Cholesterol profile is still not satisfactory with bad cholesterol only minimally improved from November 2023.  She is on Repatha injections and may consider switching to Leqvio injections if she is willing but will require insurance preauthorization

## 2023-01-08 ENCOUNTER — Telehealth: Payer: Self-pay | Admitting: Neurology

## 2023-01-08 NOTE — Telephone Encounter (Signed)
Please advise pt letter was sent via Mychart.

## 2023-01-08 NOTE — Telephone Encounter (Signed)
Pt states she was told by Dr Pearlean Brownie that he confirmed pt never had a TIA, Pt was told that Dr Pearlean Brownie would write a letter of release so that pt could move forward with a knee surgery.  Pt has yet to receive the letter, please f/u

## 2023-01-08 NOTE — Telephone Encounter (Signed)
Pt returned phone call, would like a call back.  

## 2023-01-28 ENCOUNTER — Institutional Professional Consult (permissible substitution): Payer: Medicare HMO | Admitting: Neurology

## 2023-02-19 ENCOUNTER — Other Ambulatory Visit: Payer: Self-pay

## 2023-02-19 ENCOUNTER — Emergency Department (HOSPITAL_COMMUNITY)
Admission: EM | Admit: 2023-02-19 | Discharge: 2023-02-19 | Disposition: A | Discharge status: Home / Self Care | Payer: Medicare HMO | Attending: Emergency Medicine | Admitting: Emergency Medicine

## 2023-02-19 ENCOUNTER — Encounter (HOSPITAL_COMMUNITY): Payer: Self-pay

## 2023-02-19 ENCOUNTER — Emergency Department (HOSPITAL_COMMUNITY): Payer: Medicare HMO

## 2023-02-19 DIAGNOSIS — N189 Chronic kidney disease, unspecified: Secondary | ICD-10-CM | POA: Diagnosis not present

## 2023-02-19 DIAGNOSIS — R5383 Other fatigue: Secondary | ICD-10-CM | POA: Diagnosis not present

## 2023-02-19 DIAGNOSIS — Z7902 Long term (current) use of antithrombotics/antiplatelets: Secondary | ICD-10-CM | POA: Diagnosis not present

## 2023-02-19 DIAGNOSIS — R531 Weakness: Secondary | ICD-10-CM | POA: Insufficient documentation

## 2023-02-19 DIAGNOSIS — R0602 Shortness of breath: Secondary | ICD-10-CM | POA: Insufficient documentation

## 2023-02-19 DIAGNOSIS — R059 Cough, unspecified: Secondary | ICD-10-CM | POA: Insufficient documentation

## 2023-02-19 DIAGNOSIS — R079 Chest pain, unspecified: Secondary | ICD-10-CM | POA: Diagnosis present

## 2023-02-19 LAB — CBC WITH DIFFERENTIAL/PLATELET
Abs Immature Granulocytes: 0.04 10*3/uL (ref 0.00–0.07)
Basophils Absolute: 0.1 10*3/uL (ref 0.0–0.1)
Basophils Relative: 1 %
Eosinophils Absolute: 0.2 10*3/uL (ref 0.0–0.5)
Eosinophils Relative: 2 %
HCT: 34.8 % — ABNORMAL LOW (ref 36.0–46.0)
Hemoglobin: 11.6 g/dL — ABNORMAL LOW (ref 12.0–15.0)
Immature Granulocytes: 0 %
Lymphocytes Relative: 16 %
Lymphs Abs: 1.6 10*3/uL (ref 0.7–4.0)
MCH: 33.1 pg (ref 26.0–34.0)
MCHC: 33.3 g/dL (ref 30.0–36.0)
MCV: 99.4 fL (ref 80.0–100.0)
Monocytes Absolute: 1.4 10*3/uL — ABNORMAL HIGH (ref 0.1–1.0)
Monocytes Relative: 14 %
Neutro Abs: 6.9 10*3/uL (ref 1.7–7.7)
Neutrophils Relative %: 67 %
Platelets: 222 10*3/uL (ref 150–400)
RBC: 3.5 MIL/uL — ABNORMAL LOW (ref 3.87–5.11)
RDW: 13.2 % (ref 11.5–15.5)
WBC: 10.2 10*3/uL (ref 4.0–10.5)
nRBC: 0 % (ref 0.0–0.2)

## 2023-02-19 LAB — COMPREHENSIVE METABOLIC PANEL
ALT: 15 U/L (ref 0–44)
AST: 19 U/L (ref 15–41)
Albumin: 3.3 g/dL — ABNORMAL LOW (ref 3.5–5.0)
Alkaline Phosphatase: 62 U/L (ref 38–126)
Anion gap: 9 (ref 5–15)
BUN: 19 mg/dL (ref 8–23)
CO2: 26 mmol/L (ref 22–32)
Calcium: 8.7 mg/dL — ABNORMAL LOW (ref 8.9–10.3)
Chloride: 98 mmol/L (ref 98–111)
Creatinine, Ser: 1.13 mg/dL — ABNORMAL HIGH (ref 0.44–1.00)
GFR, Estimated: 46 mL/min — ABNORMAL LOW (ref >60)
Glucose, Bld: 117 mg/dL — ABNORMAL HIGH (ref 70–99)
Potassium: 4.5 mmol/L (ref 3.5–5.1)
Sodium: 133 mmol/L — ABNORMAL LOW (ref 135–145)
Total Bilirubin: 0.6 mg/dL (ref 0.3–1.2)
Total Protein: 6.2 g/dL — ABNORMAL LOW (ref 6.5–8.1)

## 2023-02-19 LAB — TROPONIN I (HIGH SENSITIVITY)
Troponin I (High Sensitivity): 21 ng/L — ABNORMAL HIGH (ref <18)
Troponin I (High Sensitivity): 21 ng/L — ABNORMAL HIGH (ref <18)

## 2023-02-19 MED ORDER — SODIUM CHLORIDE 0.9 % IV BOLUS
1000.0000 mL | Freq: Once | INTRAVENOUS | Status: AC
  Administered 2023-02-19: 1000 mL via INTRAVENOUS

## 2023-02-19 NOTE — ED Provider Triage Note (Cosign Needed Addendum)
Emergency Medicine Provider Triage Evaluation Note  Gina Stewart , a 89 y.o. female  was evaluated in triage.  Pt complains of viral symptoms along with chest pain.  Review of Systems  Positive:  Negative:   Physical Exam  BP (!) 148/58   Pulse (!) 55   Temp 98.2 F (36.8 C) (Oral)   Resp 18   SpO2 97%  Gen:   Awake, no distress   Resp:  Normal effort  MSK:   Moves extremities without difficulty  Other:    Medical Decision Making  Medically screening exam initiated at 2:17 PM.  Appropriate orders placed.  [name] was informed that the remainder of the evaluation will be completed by another provider, this initial triage assessment does not replace that evaluation, and the importance of remaining in the ED until their evaluation is complete.  Intermittent dizziness and left ear pain x1 month. Also went to UC today for fatigue, sinus pressure/pain, productive cough, mild nausea, SOB, and chest discomfort that started Thursday and has been progressing in severity since. UC referred to ED given chest pain. Also endorsing fever or 100.97F.  Of note, patient specifically requesting imaging to see why her left ear has been hurting. Also ordering CT head to assess for pts dizziness.  Denies vomiting, diarrhea, dysuria, hematuria.   Gina Stewart, [place] 02/19/23 1425    Gina Stewart, [place] 02/19/23 1433

## 2023-02-19 NOTE — ED Triage Notes (Signed)
Pt has multiple complaints:  Increased fatigue, sinus pressure/pain, left ear pain, shob and chest pain discomfort since Thursday night.

## 2023-02-19 NOTE — ED Provider Notes (Signed)
[facility] Provider Note   CSN: [id number] Arrival date & time: 02/19/23  1349     History  Chief Complaint  Patient presents with   Fatigue   Shortness of Breath    Gina Stewart is a 89 y.o. female.  Patient here with viral type symptoms last few days, ear pain, congestion, sinus pain, cough.  She only has chest pain with cough.  She not having any active chest pain right now or today.  Had may be some chills but no specific fever.  Already had negative COVID test at urgent care today.  Is feeling dehydrated.  No pain with urination.  Nothing makes it worse or better.  Denies any headache or neck pain but overall just not feeling well.  She has history of long COVID.  History of high cholesterol.  The history is provided by the patient.       Home Medications Prior to Admission medications   Medication Sig Start Date End Date Taking? Authorizing Provider  amLODipine (NORVASC) 10 MG tablet Take 10 mg by mouth daily.    [provider]  Biotin 10 MG TABS Take 10 mg by mouth daily. 06/27/11   [provider]  Cholecalciferol (VITAMIN D-1000 MAX ST) 25 MCG (1000 UT) tablet Take 1,000 Units by mouth daily.    [provider]  clopidogrel (PLAVIX) 75 MG tablet Take 1 tablet (75 mg total) by mouth daily. 11/07/22   Pricilla Loveless, MD  famotidine (PEPCID) 20 MG tablet Take 20 mg by mouth daily.    [provider]  gabapentin (NEURONTIN) 100 MG capsule Take 1-2 capsules twice daily. 01/10/17   [provider]  HYDROcodone-acetaminophen (NORCO) 10-325 MG tablet Take 1 tablet by mouth every 6 (six) hours as needed for severe pain.    [provider]  ipratropium (ATROVENT) 0.06 % nasal spray Place 1 spray into both nostrils as needed. 06/08/22   [provider]  levothyroxine (SYNTHROID, LEVOTHROID) 75 MCG tablet Take 75 mcg by mouth daily before breakfast.    [provider]  LOKELMA 10 g PACK packet SMARTSIG:1 Packet(s) By Mouth 3 Times a Week    [provider]  losartan (COZAAR) 100 MG tablet Take 100 mg by mouth daily.    [provider]  Magnesium Gluconate 550 MG TABS Take 30 mg by mouth at bedtime.    [provider]  metoprolol tartrate (LOPRESSOR) 25 MG tablet Take 25 mg by mouth 2 (two) times daily.    [provider]  REPATHA SURECLICK 140 MG/ML SOAJ Inject 140 mg into the skin every 14 (fourteen) days. 06/12/22   [provider]  rosuvastatin (CRESTOR) 20 MG tablet Take 1 tablet (20 mg total) by mouth daily. 03/27/22   Ghimire, Werner Lean, MD  Specialty Vitamins Products (ICAPS LUTEIN & ZEAXANTHIN PO) Take 1 tablet by mouth in the morning and at bedtime.    [provider]  triamcinolone acetonide (KENALOG-40) 40 MG/ML injection Inject 40 mg into the articular space once. Given at clinic 07/26/22   [provider]      Allergies    Acrylic polymer [carbomer], Atorvastatin calcium, Ceclor [cefaclor], Cephalosporins, Metronidazole, Morphine and codeine, Other, Penicillins, Septra [sulfamethoxazole-trimethoprim], and Tramadol    Review of Systems   Review of Systems  Physical Exam Updated Vital Signs BP (!) 150/52   Pulse 76   Temp 97.8 F (36.6 C) (Oral)   Resp 15  SpO2 96%  Physical Exam Vitals and nursing note reviewed.  Constitutional:      General: She is not in acute distress.    Appearance: She is well-developed.  HENT:     Head: Normocephalic and atraumatic.     Mouth/Throat:     Mouth: Mucous membranes are moist.  Eyes:     Extraocular Movements: Extraocular movements intact.     Conjunctiva/sclera: Conjunctivae normal.     Pupils: Pupils are equal, round, and reactive to light.  Cardiovascular:     Rate and Rhythm: Normal rate and regular rhythm.     Pulses: Normal pulses.     Heart sounds: Normal heart sounds. No murmur heard. Pulmonary:     Effort: Pulmonary  effort is normal. No respiratory distress.     Breath sounds: Normal breath sounds. No decreased breath sounds.  Abdominal:     Palpations: Abdomen is soft.     Tenderness: There is no abdominal tenderness.  Musculoskeletal:        General: No swelling. Normal range of motion.     Cervical back: Normal range of motion and neck supple.     Right lower leg: No edema.     Left lower leg: No edema.  Skin:    General: Skin is warm and dry.     Capillary Refill: Capillary refill takes less than 2 seconds.  Neurological:     General: No focal deficit present.     Mental Status: She is alert and oriented to person, place, and time.     Cranial Nerves: No cranial nerve deficit.     Motor: No weakness.     Comments: 5+ out of 5 strength throughout, normal sensation, no drift, normal speech  Psychiatric:        Mood and Affect: Mood normal.     ED Results / Procedures / Treatments   Labs (all labs ordered are listed, but only abnormal results are displayed) Labs Reviewed  COMPREHENSIVE METABOLIC PANEL - Abnormal; Notable for the following components:      Result Value   Sodium 133 (*)    Glucose, Bld 117 (*)    Creatinine, Ser 1.13 (*)    Calcium 8.7 (*)    Total Protein 6.2 (*)    Albumin 3.3 (*)    GFR, Estimated 46 (*)    All other components within normal limits  CBC WITH DIFFERENTIAL/PLATELET - Abnormal; Notable for the following components:   RBC 3.50 (*)    Hemoglobin 11.6 (*)    HCT 34.8 (*)    Monocytes Absolute 1.4 (*)    All other components within normal limits  TROPONIN I (HIGH SENSITIVITY) - Abnormal; Notable for the following components:   Troponin I (High Sensitivity) 21 (*)    All other components within normal limits  TROPONIN I (HIGH SENSITIVITY) - Abnormal; Notable for the following components:   Troponin I (High Sensitivity) 21 (*)    All other components within normal limits    EKG EKG Interpretation Date/Time:  Tuesday February 19 2023 18:58:51  EDT Ventricular Rate:  58 PR Interval:  137 QRS Duration:  78 QT Interval:  450 QTC Calculation: 442 R Axis:   68  Text Interpretation: Sinus rhythm Consider left ventricular hypertrophy Confirmed by Virgina Norfolk [phone]) on 02/19/2023 7:30:49 PM  Radiology CT Maxillofacial WO CM  Result Date: 02/19/2023 CLINICAL DATA:  Shortness of breath, TMJ pain. Altered mental status. EXAM: CT HEAD WITHOUT CONTRAST CT MAXILLOFACIAL WITHOUT CONTRAST TECHNIQUE:  Multidetector CT imaging of the head and maxillofacial structures were performed using the standard protocol without intravenous contrast. Multiplanar CT image reconstructions of the maxillofacial structures were also generated. RADIATION DOSE REDUCTION: This exam was performed according to the departmental dose-optimization program which includes automated exposure control, adjustment of the mA and/or kV according to patient size and/or use of iterative reconstruction technique. COMPARISON:  Brain MRI 11/07/2022. FINDINGS: CT HEAD FINDINGS Brain: There is no acute intracranial hemorrhage, extra-axial fluid collection, or acute infarct. Parenchymal volume is normal. The ventricles are normal in size. Gray-white differentiation is preserved. Patchy and confluent hypodensity in the supratentorial white matter is consistent with underlying chronic small-vessel ischemic change. The pituitary and suprasellar region are normal. There is no mass lesion. There is no mass effect or midline shift. Vascular: There is calcification of the bilateral carotid siphons. Skull: Normal. Negative for fracture or focal lesion. Other: The mastoid air cells and middle ear cavities are clear. CT MAXILLOFACIAL FINDINGS Osseous: There is no acute osseous abnormality or suspicious osseous lesion. Temporomandibular alignment is maintained. There is flattening of the left mandibular condyle with mild osseous irregularity, progressed since 2023. There is no convincing osseous erosion. The  right temporomandibular joint is unremarkable. Orbits: Bilateral lens implants are in place. The globes and orbits are otherwise unremarkable. Sinuses: The paranasal sinuses are clear. Soft tissues: Unremarkable. IMPRESSION: 1. No acute intracranial pathology. 2. Mild irregularity of the left occipital condyle is progressed since 2023. 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 Electronically Signed   By: Lesia Hausen M.D.   On: 02/19/2023 16:07   CT Head Wo Contrast  Result Date: 02/19/2023 CLINICAL DATA:  Shortness of breath, TMJ pain. Altered mental status. EXAM: CT HEAD WITHOUT CONTRAST CT MAXILLOFACIAL WITHOUT CONTRAST TECHNIQUE: Multidetector CT imaging of the head and maxillofacial structures were performed using the standard protocol without intravenous contrast. Multiplanar CT image reconstructions of the maxillofacial structures were also generated. RADIATION DOSE REDUCTION: This exam was performed according to the departmental dose-optimization program which includes automated exposure control, adjustment of the mA and/or kV according to patient size and/or use of iterative reconstruction technique. COMPARISON:  Brain MRI 11/07/2022. FINDINGS: CT HEAD FINDINGS Brain: There is no acute intracranial hemorrhage, extra-axial fluid collection, or acute infarct. Parenchymal volume is normal. The ventricles are normal in size. Gray-white differentiation is preserved. Patchy and confluent hypodensity in the supratentorial white matter is consistent with underlying chronic small-vessel ischemic change. The pituitary and suprasellar region are normal. There is no mass lesion. There is no mass effect or midline shift. Vascular: There is calcification of the bilateral carotid siphons. Skull: Normal. Negative for fracture or focal lesion. Other: The mastoid air cells and middle ear cavities are clear. CT MAXILLOFACIAL FINDINGS Osseous:  There is no acute osseous abnormality or suspicious osseous lesion. Temporomandibular alignment is maintained. There is flattening of the left mandibular condyle with mild osseous irregularity, progressed since 2023. There is no convincing osseous erosion. The right temporomandibular joint is unremarkable. Orbits: Bilateral lens implants are in place. The globes and orbits are otherwise unremarkable. Sinuses: The paranasal sinuses are clear. Soft tissues: Unremarkable. IMPRESSION: 1. No acute intracranial pathology. 2. Mild irregularity of the left occipital condyle is progressed since 2023. 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 Electronically Signed   By: Lesia Hausen M.D.   On: 02/19/2023 16:07   DG Chest 2 View  Result Date:  02/19/2023 CLINICAL DATA:  Chest pain EXAM: CHEST - 2 VIEW COMPARISON:  X-ray 08/10/2022 and older FINDINGS: Hyperinflation. Calcified aorta. Calcifications along the tracheobronchial tree. No consolidation, pneumothorax or edema. Slight blunting of the costophrenic angles. Tiny effusion versus pleural thickening. Degenerative changes of the spine. Degenerative changes of the shoulders, right-greater-than-left. IMPRESSION: Hyperinflation.  Question tiny effusions versus pleural thickening Electronically Signed   By: Karen Kays M.D.   On: 02/19/2023 15:20    Procedures Procedures    Medications Ordered in ED Medications  sodium chloride 0.9 % bolus 1,000 mL (0 mLs Intravenous Stopped 02/19/23 2024)    ED Course/ Medical Decision Making/ A&P                                 Medical Decision Making  Gina Stewart is here with generalized weakness, cough, chest pain, shortness of breath, viral symptoms.  Overall mildly hypertensive but otherwise normal vitals.  She is well-appearing.  Neurologically she is intact.  She has been having a lot of sinus congestion and cough and having chest  pain when she coughs.  Not having any active chest pain now.  Has not had any chest pain today.  No exertional chest pain.  EKG shows sinus rhythm.  No ischemic changes.  She is very well-appearing.  She has a mild CKD, reflux, long COVID, high cholesterol history.  She is already had blood work done prior to my evaluation with CBC, CMP, troponin, head and face CT.  Chest x-ray as well.  Per my review and interpretation troponins 21 x 2.  EKG reassuring.  Does not have any cardiac sounding chest pain.  I suspect that troponin from chronic kidney disease.  Chest x-ray showed no evidence of pneumonia or pneumothorax.  CT of the head and face were unremarkable.  She has may be some sort of inflammatory process of the left occipital condyle.  Not having any pain there.  This seems to be chronic given that it seen on prior scans.  Overall she is not having any urinary symptoms.  Mostly concerned about some dehydration.  Will give IV fluids and reevaluate.  She had a negative COVID test already today.  Sounds like she is having some sort of viral process.  May be some ongoing long COVID effects.  Anticipate discharge after fluids.  Patient feeling much better.  Overall discharged in good condition.  Will have her follow-up with her primary care doctor.  This chart was dictated using voice recognition software.  Despite best efforts to proofread,  errors can occur which can change the documentation meaning.         Final Clinical Impression(s) / ED Diagnoses Final diagnoses:  Other fatigue  Weakness    Rx / DC Orders ED Discharge Orders     None         Virgina Norfolk, DO 02/19/23 2132

## 2023-03-09 ENCOUNTER — Emergency Department (HOSPITAL_BASED_OUTPATIENT_CLINIC_OR_DEPARTMENT_OTHER)
Admission: EM | Admit: 2023-03-09 | Discharge: 2023-03-09 | Disposition: A | Discharge status: Home / Self Care | Payer: Medicare HMO | Source: Home / Self Care | Attending: Emergency Medicine | Admitting: Emergency Medicine

## 2023-03-09 ENCOUNTER — Encounter (HOSPITAL_BASED_OUTPATIENT_CLINIC_OR_DEPARTMENT_OTHER): Payer: Self-pay

## 2023-03-09 ENCOUNTER — Other Ambulatory Visit: Payer: Self-pay

## 2023-03-09 DIAGNOSIS — Z1152 Encounter for screening for COVID-19: Secondary | ICD-10-CM | POA: Insufficient documentation

## 2023-03-09 DIAGNOSIS — Z79899 Other long term (current) drug therapy: Secondary | ICD-10-CM | POA: Insufficient documentation

## 2023-03-09 DIAGNOSIS — B079 Viral wart, unspecified: Secondary | ICD-10-CM | POA: Insufficient documentation

## 2023-03-09 DIAGNOSIS — E039 Hypothyroidism, unspecified: Secondary | ICD-10-CM | POA: Insufficient documentation

## 2023-03-09 DIAGNOSIS — I129 Hypertensive chronic kidney disease with stage 1 through stage 4 chronic kidney disease, or unspecified chronic kidney disease: Secondary | ICD-10-CM | POA: Insufficient documentation

## 2023-03-09 DIAGNOSIS — N3000 Acute cystitis without hematuria: Secondary | ICD-10-CM | POA: Diagnosis not present

## 2023-03-09 DIAGNOSIS — R350 Frequency of micturition: Secondary | ICD-10-CM | POA: Diagnosis present

## 2023-03-09 DIAGNOSIS — N189 Chronic kidney disease, unspecified: Secondary | ICD-10-CM | POA: Insufficient documentation

## 2023-03-09 LAB — CBC WITH DIFFERENTIAL/PLATELET
Abs Immature Granulocytes: 0.02 10*3/uL (ref 0.00–0.07)
Basophils Absolute: 0.1 10*3/uL (ref 0.0–0.1)
Basophils Relative: 1 %
Eosinophils Absolute: 0.2 10*3/uL (ref 0.0–0.5)
Eosinophils Relative: 2 %
HCT: 32.9 % — ABNORMAL LOW (ref 36.0–46.0)
Hemoglobin: 10.8 g/dL — ABNORMAL LOW (ref 12.0–15.0)
Immature Granulocytes: 0 %
Lymphocytes Relative: 24 %
Lymphs Abs: 2 10*3/uL (ref 0.7–4.0)
MCH: 31.7 pg (ref 26.0–34.0)
MCHC: 32.8 g/dL (ref 30.0–36.0)
MCV: 96.5 fL (ref 80.0–100.0)
Monocytes Absolute: 1 10*3/uL (ref 0.1–1.0)
Monocytes Relative: 12 %
Neutro Abs: 5.1 10*3/uL (ref 1.7–7.7)
Neutrophils Relative %: 61 %
Platelets: 301 10*3/uL (ref 150–400)
RBC: 3.41 MIL/uL — ABNORMAL LOW (ref 3.87–5.11)
RDW: 13.3 % (ref 11.5–15.5)
WBC: 8.3 10*3/uL (ref 4.0–10.5)
nRBC: 0 % (ref 0.0–0.2)

## 2023-03-09 LAB — URINALYSIS, W/ REFLEX TO CULTURE (INFECTION SUSPECTED)
Bilirubin Urine: NEGATIVE
Glucose, UA: NEGATIVE mg/dL
Hgb urine dipstick: NEGATIVE
Ketones, ur: NEGATIVE mg/dL
Nitrite: NEGATIVE
Protein, ur: NEGATIVE mg/dL
Specific Gravity, Urine: 1.015 (ref 1.005–1.030)
pH: 7 (ref 5.0–8.0)

## 2023-03-09 LAB — BASIC METABOLIC PANEL
Anion gap: 10 (ref 5–15)
BUN: 27 mg/dL — ABNORMAL HIGH (ref 8–23)
CO2: 25 mmol/L (ref 22–32)
Calcium: 8.8 mg/dL — ABNORMAL LOW (ref 8.9–10.3)
Chloride: 103 mmol/L (ref 98–111)
Creatinine, Ser: 0.97 mg/dL (ref 0.44–1.00)
GFR, Estimated: 55 mL/min — ABNORMAL LOW (ref >60)
Glucose, Bld: 87 mg/dL (ref 70–99)
Potassium: 4 mmol/L (ref 3.5–5.1)
Sodium: 138 mmol/L (ref 135–145)

## 2023-03-09 LAB — SARS CORONAVIRUS 2 BY RT PCR: SARS Coronavirus 2 by RT PCR: NEGATIVE

## 2023-03-09 MED ORDER — SODIUM CHLORIDE 0.9 % IV BOLUS
1000.0000 mL | Freq: Once | INTRAVENOUS | Status: AC
  Administered 2023-03-09: 1000 mL via INTRAVENOUS

## 2023-03-09 MED ORDER — DOXYCYCLINE HYCLATE 100 MG PO CAPS: 100.0000 mg | ORAL_CAPSULE | Freq: Two times a day (BID) | ORAL | 0 refills | Status: AC

## 2023-03-09 MED ORDER — SALICYLIC ACID 17 % EX GEL: Freq: Every day | CUTANEOUS | 0 refills | Status: AC

## 2023-03-09 MED ORDER — FOSFOMYCIN TROMETHAMINE 3 G PO PACK
3.0000 g | PACK | Freq: Once | ORAL | Status: AC
  Administered 2023-03-09: 3 g via ORAL
  Filled 2023-03-09: qty 3

## 2023-03-09 NOTE — ED Triage Notes (Signed)
Pt reports lightheadedness, "not feeling good", and dehydration since this morning. Pt also reports dark urine.

## 2023-03-09 NOTE — ED Provider Notes (Signed)
[place] [facility] Provider Note  CSN: [id number] Arrival date & time: 03/09/23 1519  Chief Complaint(s) Dehydration  HPI Gina Stewart is a 89 y.o. female with history of hypertension, CKD presenting to the emergency department with feeling dehydrated.  Patient reports mild generalized weakness since yesterday.  Feels similar to how she has felt before when she has been dehydrated.  Has not been out in the sun or anything.  Reports some mild increased urinary frequency and darker urine as well, no dysuria.  No fevers or chills, chest pain, cough, shortness of breath, runny nose, sore throat, abdominal pain, diarrhea, leg swelling, or other new symptoms.  Symptoms began yesterday and are persistent, she has an upcoming knee replacement surgery she wanted to be safe, and get checked out.  She also reports a bump on her right index finger that is been there for a week and not improving.   Past Medical History Past Medical History:  Diagnosis Date   Cancer [facility])    GERD (gastroesophageal reflux disease)    High cholesterol    Hypertension    Thyroid disease    Patient Active Problem List   Diagnosis Date Noted   Numbness and tingling of left side of face 03/25/2022   Acquired hypothyroidism 10/05/2015   Benign essential hypertension 10/05/2015   Gastroesophageal reflux disease 10/05/2015   Hyperlipidemia LDL goal <70 10/05/2015   Home Medication(s) Prior to Admission medications   Medication Sig Start Date End Date Taking? Authorizing Provider  doxycycline (VIBRAMYCIN) 100 MG capsule Take 1 capsule (100 mg total) by mouth 2 (two) times daily for 7 days. 03/09/23 03/16/23 Yes Lonell Grandchild, MD  salicylic acid 17 % gel Apply topically daily. To wart 03/09/23  Yes Lonell Grandchild, MD  amLODipine (NORVASC) 10 MG tablet Take 10 mg by mouth daily.    [provider]  Biotin 10 MG TABS Take 10 mg by mouth daily. 06/27/11   [provider]  Cholecalciferol (VITAMIN D-1000 MAX ST) 25 MCG (1000 UT) tablet Take 1,000 Units by mouth daily.    [provider]  clopidogrel (PLAVIX) 75 MG tablet Take 1 tablet (75 mg total) by mouth daily. 11/07/22   Pricilla Loveless, MD  famotidine (PEPCID) 20 MG tablet Take 20 mg by mouth daily.    [provider]  gabapentin (NEURONTIN) 100 MG capsule Take 1-2 capsules twice daily. 01/10/17   [provider]  HYDROcodone-acetaminophen (NORCO) 10-325 MG tablet Take 1 tablet by mouth every 6 (six) hours as needed for severe pain.    [provider]  ipratropium (ATROVENT) 0.06 % nasal spray Place 1 spray into both nostrils as needed. 06/08/22   [provider]  levothyroxine (SYNTHROID, LEVOTHROID) 75 MCG tablet Take 75 mcg by mouth daily before breakfast.    [provider]  LOKELMA 10 g PACK packet SMARTSIG:1 Packet(s) By Mouth 3 Times a Week    [provider]  losartan (COZAAR) 100 MG tablet Take 100 mg by mouth daily.    [provider]  Magnesium Gluconate 550 MG TABS Take 30 mg by mouth at bedtime.    [provider]  metoprolol tartrate (LOPRESSOR) 25 MG tablet Take 25 mg by mouth 2 (two) times daily.    [provider]  REPATHA SURECLICK 140 MG/ML SOAJ Inject 140 mg into the skin every 14 (fourteen) days. 06/12/22   [provider]  rosuvastatin (CRESTOR) 20 MG tablet Take 1  tablet (20 mg total) by mouth daily. 03/27/22   Ghimire, Werner Lean, MD  Specialty Vitamins Products (ICAPS LUTEIN & ZEAXANTHIN PO) Take 1 tablet by mouth in the morning and at bedtime.    [provider]  triamcinolone acetonide (KENALOG-40) 40 MG/ML injection Inject 40 mg into the articular space once. Given at clinic 07/26/22   [provider]                                                                                                                                    Past Surgical History Past  Surgical History:  Procedure Laterality Date   APPENDECTOMY     BACK SURGERY     CATARACT EXTRACTION     CHOLECYSTECTOMY     HERNIA REPAIR     NASAL SEPTUM SURGERY     NEPHRECTOMY Right    TONSILLECTOMY     Family History History reviewed. No pertinent family history.  Social History Social History   Tobacco Use   Smoking status: Never   Smokeless tobacco: Never  Substance Use Topics   Alcohol use: No   Drug use: No   Allergies Acrylic polymer [carbomer], Atorvastatin calcium, Ceclor [cefaclor], Cephalosporins, Metronidazole, Morphine and codeine, Other, Penicillins, Septra [sulfamethoxazole-trimethoprim], and Tramadol  Review of Systems Review of Systems  All other systems reviewed and are negative.   Physical Exam Vital Signs  I have reviewed the triage vital signs BP (!) 147/61   Pulse 77   Temp 98 F (36.7 C) (Oral)   Resp 20   Ht 4\' 11"  (1.499 m)   Wt 51.3 kg   SpO2 96%   BMI 22.82 kg/m  Physical Exam Vitals and nursing note reviewed.  Constitutional:      General: She is not in acute distress.    Appearance: She is well-developed.  HENT:     Head: Normocephalic and atraumatic.     Mouth/Throat:     Mouth: Mucous membranes are moist.  Eyes:     Pupils: Pupils are equal, round, and reactive to light.  Cardiovascular:     Rate and Rhythm: Normal rate and regular rhythm.     Heart sounds: No murmur heard. Pulmonary:     Effort: Pulmonary effort is normal. No respiratory distress.     Breath sounds: Normal breath sounds.  Abdominal:     General: Abdomen is flat.     Palpations: Abdomen is soft.     Tenderness: There is no abdominal tenderness.  Musculoskeletal:        General: No tenderness.     Right lower leg: No edema.     Left lower leg: No edema.  Skin:    General: Skin is warm and dry.     Comments: Right index finger with a likely wart over the palmar aspect between the PIP and DIP joints, minimal surrounding erythema, is tender.  No  purulence or fluctuance.  No fusiform digit swelling or abnormal range of motion of the finger.  Range of motion about the hand is normal.  Neurological:     General: No focal deficit present.     Mental Status: She is alert. Mental status is at baseline.  Psychiatric:        Mood and Affect: Mood normal.        Behavior: Behavior normal.     ED Results and Treatments Labs (all labs ordered are listed, but only abnormal results are displayed) Labs Reviewed  BASIC METABOLIC PANEL - Abnormal; Notable for the following components:      Result Value   BUN 27 (*)    Calcium 8.8 (*)    GFR, Estimated 55 (*)    All other components within normal limits  CBC WITH DIFFERENTIAL/PLATELET - Abnormal; Notable for the following components:   RBC 3.41 (*)    Hemoglobin 10.8 (*)    HCT 32.9 (*)    All other components within normal limits  URINALYSIS, W/ REFLEX TO CULTURE (INFECTION SUSPECTED) - Abnormal; Notable for the following components:   Leukocytes,Ua MODERATE (*)    Bacteria, UA FEW (*)    All other components within normal limits  SARS CORONAVIRUS 2 BY RT PCR  URINE CULTURE                                                                                                                          Radiology No results found.  Pertinent labs & imaging results that were available during my care of the patient were reviewed by me and considered in my medical decision making (see MDM for details).  Medications Ordered in ED Medications  fosfomycin (MONUROL) packet 3 g (has no administration in time range)  sodium chloride 0.9 % bolus 1,000 mL (1,000 mLs Intravenous New Bag/Given 03/09/23 1635)                                                                                                                                     Procedures Procedures  (including critical care time)  Medical Decision Making / ED Course   MDM:  89 year old female presenting to the emergency department  with mild weakness.  Patient overall well-appearing, physical exam is unremarkable.  Patient is very well-appearing for age.  Will check basic labs to evaluate for electrolyte disturbance or AKI, other causes of weakness  such as electrolyte derangement or anemia.  Will check EKG although patient has no chest pain or shortness of breath.  She does report some change in urinary symptoms we will check urinalysis.  Patient also complains of area of pain on the dorsal right index finger.  On exam it appears to be most likely a wart.  No sign of any flexor tenosynovitis or other dangerous infection.  Has some minimal erythema around and tenderness, can try short course of Keflex as well as topical wart treatment such as salicylic acid bandage.  Clinical Course as of 03/09/23 1725  Sat Mar 09, 2023  1724 Urinalysis is somewhat concerning for urine infection.  Has been reflexed to culture.  Patient has significant antibiotic allergies so we will give dose of fosfomycin in the emergency department.  Given this patient also allergic to Keflex.  Gave short course of doxycycline in case there is a superimposed bacterial infection on patient's finger wart. Will discharge patient to home. All questions answered. Patient comfortable with plan of discharge. Return precautions discussed with patient and specified on the after visit summary.  [WS]    Clinical Course User Index [WS] Lonell Grandchild, MD     Additional history obtained:  -External records from outside source obtained and reviewed including: Chart review including previous notes, labs, imaging, consultation notes including previous ER visits for similar   Lab Tests: -I ordered, reviewed, and interpreted labs.   The pertinent results include:   Labs Reviewed  BASIC METABOLIC PANEL - Abnormal; Notable for the following components:      Result Value   BUN 27 (*)    Calcium 8.8 (*)    GFR, Estimated 55 (*)    All other components within  normal limits  CBC WITH DIFFERENTIAL/PLATELET - Abnormal; Notable for the following components:   RBC 3.41 (*)    Hemoglobin 10.8 (*)    HCT 32.9 (*)    All other components within normal limits  URINALYSIS, W/ REFLEX TO CULTURE (INFECTION SUSPECTED) - Abnormal; Notable for the following components:   Leukocytes,Ua MODERATE (*)    Bacteria, UA FEW (*)    All other components within normal limits  SARS CORONAVIRUS 2 BY RT PCR  URINE CULTURE    Notable for borderline Aki, signs of UTI  EKG   EKG Interpretation Date/Time:  Saturday March 09 2023 16:09:45 EDT Ventricular Rate:  67 PR Interval:  141 QRS Duration:  80 QT Interval:  429 QTC Calculation: 453 R Axis:   49  Text Interpretation: Sinus rhythm Confirmed by Alvino Blood ([id number]) on 03/09/2023 4:21:45 PM          Medicines ordered and prescription drug management: Meds ordered this encounter  Medications   sodium chloride 0.9 % bolus 1,000 mL   fosfomycin (MONUROL) packet 3 g   doxycycline (VIBRAMYCIN) 100 MG capsule    Sig: Take 1 capsule (100 mg total) by mouth 2 (two) times daily for 7 days.    Dispense:  14 capsule    Refill:  0   salicylic acid 17 % gel    Sig: Apply topically daily. To wart    Dispense:  15 g    Refill:  0    -I have reviewed the patients home medicines and have made adjustments as needed    Reevaluation: After the interventions noted above, I reevaluated the patient and found that their symptoms have improved  Co morbidities that complicate the patient evaluation  Past Medical History:  Diagnosis Date   Cancer (HCC)    GERD (gastroesophageal reflux disease)    High cholesterol    Hypertension    Thyroid disease       Dispostion: Disposition decision including need for hospitalization was considered, and patient discharged from emergency department.    Final Clinical Impression(s) / ED Diagnoses Final diagnoses:  Acute cystitis without hematuria  Viral wart on  finger     This chart was dictated using voice recognition software.  Despite best efforts to proofread,  errors can occur which can change the documentation meaning.    Lonell Grandchild, MD 03/09/23 1725

## 2023-03-09 NOTE — Discharge Instructions (Addendum)
We evaluated you for your fatigue and weakness.  Your laboratory testing showed that you have have a mild urinary infection.  We gave you an antibiotic called fosfomycin which is a one-time dose due to your other antibiotic allergies.  I believe the spot on your finger is a wart.  I have prescribed you an ointment that you can apply to help treat the wart.  Since he did have a little bit of redness and pain around it I have given you a prescription for doxycycline in case there is also a small skin infection.  Please return if you have any worsening symptoms.  Please be sure to follow-up with your primary doctor soon as possible.

## 2023-03-10 LAB — URINE CULTURE: Culture: 30000 — AB

## 2023-03-11 ENCOUNTER — Telehealth (HOSPITAL_BASED_OUTPATIENT_CLINIC_OR_DEPARTMENT_OTHER): Payer: Self-pay | Admitting: *Deleted

## 2023-03-11 NOTE — Telephone Encounter (Signed)
Post ED Visit - Positive Culture Follow-up  Culture report reviewed by antimicrobial stewardship pharmacist: [facility] []  Enzo Bi, Pharm.D. []  Celedonio Miyamoto, Pharm.D., BCPS AQ-ID []  Garvin Fila, Pharm.D., BCPS []  Georgina Pillion, Pharm.D., BCPS []  [place], [address].D., BCPS, AAHIVP []  Estella Husk, Pharm.D., BCPS, AAHIVP []  Lysle Pearl, PharmD, BCPS []  Phillips Climes, PharmD, BCPS []  Agapito Games, PharmD, BCPS []  Verlan Friends, PharmD []  Mervyn Gay, PharmD, BCPS []  Vinnie Level, PharmD  [facility] []  Len Childs, PharmD []  Greer Pickerel, PharmD []  Adalberto Cole, PharmD []  Perlie Gold, Rph []  Lonell Face) Jean Rosenthal, PharmD []  Earl Many, PharmD []  Junita Push, PharmD []  Dorna Leitz, PharmD []  Terrilee Files, PharmD []  Lynann Beaver, PharmD []  Keturah Barre, PharmD []  Loralee Pacas, PharmD []  Bernadene Person, PharmD   Positive urine culture Treated with Fosfomycin, organism sensitive to the same and no further patient follow-up is required at this time.  Lora Paula, PharmD  Virl Axe Talley 03/11/2023, 9:53 AM

## 2023-04-12 ENCOUNTER — Emergency Department (HOSPITAL_BASED_OUTPATIENT_CLINIC_OR_DEPARTMENT_OTHER)
Admission: EM | Admit: 2023-04-12 | Discharge: 2023-04-12 | Disposition: A | Discharge status: Home / Self Care | Payer: Medicare HMO | Attending: Emergency Medicine | Admitting: Emergency Medicine

## 2023-04-12 ENCOUNTER — Other Ambulatory Visit: Payer: Self-pay

## 2023-04-12 ENCOUNTER — Emergency Department (HOSPITAL_BASED_OUTPATIENT_CLINIC_OR_DEPARTMENT_OTHER): Payer: Medicare HMO

## 2023-04-12 DIAGNOSIS — Z859 Personal history of malignant neoplasm, unspecified: Secondary | ICD-10-CM | POA: Diagnosis not present

## 2023-04-12 DIAGNOSIS — R600 Localized edema: Secondary | ICD-10-CM | POA: Insufficient documentation

## 2023-04-12 DIAGNOSIS — Z79899 Other long term (current) drug therapy: Secondary | ICD-10-CM | POA: Diagnosis not present

## 2023-04-12 DIAGNOSIS — Z7902 Long term (current) use of antithrombotics/antiplatelets: Secondary | ICD-10-CM | POA: Diagnosis not present

## 2023-04-12 DIAGNOSIS — R0602 Shortness of breath: Secondary | ICD-10-CM | POA: Diagnosis present

## 2023-04-12 DIAGNOSIS — I10 Essential (primary) hypertension: Secondary | ICD-10-CM | POA: Insufficient documentation

## 2023-04-12 LAB — CBC WITH DIFFERENTIAL/PLATELET
Abs Immature Granulocytes: 0.03 10*3/uL (ref 0.00–0.07)
Basophils Absolute: 0.1 10*3/uL (ref 0.0–0.1)
Basophils Relative: 1 %
Eosinophils Absolute: 0.2 10*3/uL (ref 0.0–0.5)
Eosinophils Relative: 2 %
HCT: 32 % — ABNORMAL LOW (ref 36.0–46.0)
Hemoglobin: 10.6 g/dL — ABNORMAL LOW (ref 12.0–15.0)
Immature Granulocytes: 0 %
Lymphocytes Relative: 19 %
Lymphs Abs: 1.6 10*3/uL (ref 0.7–4.0)
MCH: 31.7 pg (ref 26.0–34.0)
MCHC: 33.1 g/dL (ref 30.0–36.0)
MCV: 95.8 fL (ref 80.0–100.0)
Monocytes Absolute: 1 10*3/uL (ref 0.1–1.0)
Monocytes Relative: 11 %
Neutro Abs: 6 10*3/uL (ref 1.7–7.7)
Neutrophils Relative %: 67 %
Platelets: 352 10*3/uL (ref 150–400)
RBC: 3.34 MIL/uL — ABNORMAL LOW (ref 3.87–5.11)
RDW: 13.4 % (ref 11.5–15.5)
WBC: 8.9 10*3/uL (ref 4.0–10.5)
nRBC: 0 % (ref 0.0–0.2)

## 2023-04-12 LAB — COMPREHENSIVE METABOLIC PANEL
ALT: 16 U/L (ref 0–44)
AST: 19 U/L (ref 15–41)
Albumin: 3.4 g/dL — ABNORMAL LOW (ref 3.5–5.0)
Alkaline Phosphatase: 81 U/L (ref 38–126)
Anion gap: 10 (ref 5–15)
BUN: 22 mg/dL (ref 8–23)
CO2: 26 mmol/L (ref 22–32)
Calcium: 8.6 mg/dL — ABNORMAL LOW (ref 8.9–10.3)
Chloride: 100 mmol/L (ref 98–111)
Creatinine, Ser: 0.89 mg/dL (ref 0.44–1.00)
GFR, Estimated: >60 mL/min (ref >60)
Glucose, Bld: 127 mg/dL — ABNORMAL HIGH (ref 70–99)
Potassium: 3.8 mmol/L (ref 3.5–5.1)
Sodium: 136 mmol/L (ref 135–145)
Total Bilirubin: 0.6 mg/dL (ref 0.3–1.2)
Total Protein: 6.5 g/dL (ref 6.5–8.1)

## 2023-04-12 LAB — BRAIN NATRIURETIC PEPTIDE: B Natriuretic Peptide: 621.8 pg/mL — ABNORMAL HIGH (ref 0.0–100.0)

## 2023-04-12 MED ORDER — FUROSEMIDE 20 MG PO TABS: 20.0000 mg | ORAL_TABLET | Freq: Every day | ORAL | 0 refills | Status: AC

## 2023-04-12 NOTE — Discharge Instructions (Signed)
It looks as if you are carrying some extra fluid in your blood pressure is elevated.  Follow-up with your doctors including cardiology to figure out if you need more adjustments of your medications since you said you are on less blood pressure medicines and previously.  You have been given a 3-day supply of fluid pills to help get some of the fluid off you.

## 2023-04-12 NOTE — ED Provider Notes (Signed)
[place] [facility] Provider Note   CSN: [id number] Arrival date & time: 04/12/23  1525     History  Chief Complaint  Patient presents with   Shortness of Breath    Gina Stewart is a 89 y.o. female.   Shortness of Breath Patient with shortness of breath fatigue dizziness orthostasis.  States she has had for months since her knee replacement in August.  States she has had previous visits for the same but thinks she is dehydrated.  States she has been eating and drinking less and does not have as much of an appetite.  Does have some swelling in her legs.  States she is post be on 3 blood pressure medicines but now only on 2.  States the losartan she does not have anymore.  States  She does not know if it was not refilled or change but she does not have it.  No chest pain.  No fevers.  No cough.  Has had previous COVID test for similar symptoms. Past Medical History:  Diagnosis Date   Cancer (HCC)    GERD (gastroesophageal reflux disease)    High cholesterol    Hypertension    Thyroid disease       Past Surgical History:  Procedure Laterality Date   APPENDECTOMY     BACK SURGERY     CATARACT EXTRACTION     CHOLECYSTECTOMY     HERNIA REPAIR     NASAL SEPTUM SURGERY     NEPHRECTOMY Right    TONSILLECTOMY      Home Medications Prior to Admission medications   Medication Sig Start Date End Date Taking? Authorizing Provider  furosemide (LASIX) 20 MG tablet Take 1 tablet (20 mg total) by mouth daily. 04/12/23  Yes Benjiman Core, MD  amLODipine (NORVASC) 10 MG tablet Take 10 mg by mouth daily.    [provider]  Biotin 10 MG TABS Take 10 mg by mouth daily. 06/27/11   [provider]  Cholecalciferol (VITAMIN D-1000 MAX ST) 25 MCG (1000 UT) tablet Take 1,000 Units by mouth daily.    [provider]  clopidogrel (PLAVIX) 75 MG tablet Take 1 tablet (75 mg total) by mouth daily. 11/07/22   Pricilla Loveless, MD   famotidine (PEPCID) 20 MG tablet Take 20 mg by mouth daily.    [provider]  gabapentin (NEURONTIN) 100 MG capsule Take 1-2 capsules twice daily. 01/10/17   [provider]  HYDROcodone-acetaminophen (NORCO) 10-325 MG tablet Take 1 tablet by mouth every 6 (six) hours as needed for severe pain.    [provider]  ipratropium (ATROVENT) 0.06 % nasal spray Place 1 spray into both nostrils as needed. 06/08/22   [provider]  levothyroxine (SYNTHROID, LEVOTHROID) 75 MCG tablet Take 75 mcg by mouth daily before breakfast.    [provider]  LOKELMA 10 g PACK packet SMARTSIG:1 Packet(s) By Mouth 3 Times a Week    [provider]  losartan (COZAAR) 100 MG tablet Take 100 mg by mouth daily.    [provider]  Magnesium Gluconate 550 MG TABS Take 30 mg by mouth at bedtime.    [provider]  metoprolol tartrate (LOPRESSOR) 25 MG tablet Take 25 mg by mouth 2 (two) times daily.    [provider]  REPATHA SURECLICK 140 MG/ML SOAJ Inject 140 mg into the skin every 14 (fourteen) days. 06/12/22   [provider]  rosuvastatin (CRESTOR) 20 MG tablet  Take 1 tablet (20 mg total) by mouth daily. 03/27/22   Ghimire, Werner Lean, MD  salicylic acid 17 % gel Apply topically daily. To wart 03/09/23   Lonell Grandchild, MD  Specialty Vitamins Products (ICAPS LUTEIN & ZEAXANTHIN PO) Take 1 tablet by mouth in the morning and at bedtime.    [provider]  triamcinolone acetonide (KENALOG-40) 40 MG/ML injection Inject 40 mg into the articular space once. Given at clinic 07/26/22   [provider]      Allergies    Acrylic polymer [carbomer], Atorvastatin calcium, Ceclor [cefaclor], Cephalosporins, Metronidazole, Morphine and codeine, Other, Penicillins, Septra [sulfamethoxazole-trimethoprim], and Tramadol    Review of Systems   Review of Systems  Respiratory:  Positive for shortness of breath.     Physical  Exam Updated Vital Signs BP (!) 218/75   Pulse 75   Temp 98 F (36.7 C) (Oral)   Resp 14   Ht 4\' 11"  (1.499 m)   Wt 50.3 kg   SpO2 99%   BMI 22.42 kg/m  Physical Exam Vitals and nursing note reviewed.  HENT:     Head: Normocephalic.  Eyes:     Extraocular Movements: Extraocular movements intact.  Cardiovascular:     Rate and Rhythm: Regular rhythm.  Pulmonary:     Breath sounds: No wheezing or rhonchi.  Chest:     Chest wall: No tenderness.  Abdominal:     Tenderness: There is no abdominal tenderness.  Musculoskeletal:     Right lower leg: Edema present.     Left lower leg: Edema present.     Comments: Edema bilateral lower extremities.  Neurological:     Mental Status: She is alert and oriented to person, place, and time.     ED Results / Procedures / Treatments   Labs (all labs ordered are listed, but only abnormal results are displayed) Labs Reviewed  BRAIN NATRIURETIC PEPTIDE - Abnormal; Notable for the following components:      Result Value   B Natriuretic Peptide 621.8 (*)    All other components within normal limits  COMPREHENSIVE METABOLIC PANEL - Abnormal; Notable for the following components:   Glucose, Bld 127 (*)    Calcium 8.6 (*)    Albumin 3.4 (*)    All other components within normal limits  CBC WITH DIFFERENTIAL/PLATELET - Abnormal; Notable for the following components:   RBC 3.34 (*)    Hemoglobin 10.6 (*)    HCT 32.0 (*)    All other components within normal limits    EKG EKG Interpretation Date/Time:  Friday April 12 2023 15:39:27 EDT Ventricular Rate:  63 PR Interval:  135 QRS Duration:  77 QT Interval:  435 QTC Calculation: 446 R Axis:   69  Text Interpretation: Sinus rhythm Consider left ventricular hypertrophy T waves more prominant Confirmed by Benjiman Core [phone]) on 04/12/2023 3:57:00 PM  Radiology DG Chest 2 View  Result Date: 04/12/2023 CLINICAL DATA:  Shortness of breath EXAM: CHEST - 2 VIEW COMPARISON:   02/19/2023 FINDINGS: Trace pleural effusions versus pleural thickening. Cardiomegaly with aortic atherosclerosis. No consolidation or pneumothorax. IMPRESSION: Cardiomegaly with trace pleural effusions versus pleural thickening. Electronically Signed   By: Jasmine Pang M.D.   On: 04/12/2023 17:32    Procedures Procedures    Medications Ordered in ED Medications - No data to display  ED Course/ Medical Decision Making/ A&P  Medical Decision Making Amount and/or Complexity of Data Reviewed Labs: ordered. Radiology: ordered.  Risk Prescription drug management.   Patient with fatigue.  Has had for months since surgery.  Differential diagnosis does include CHF, hypertension, anemia.  Pulmonary embolism felt less likely.  Feels more orthostatic and fatigued.  Also has edema but on both legs.    BNP is elevated.  Blood pressure also elevated but off some of her medicines.  Blood pressure did come down somewhat on its own.  Short-term follow-up with her doctors.  Will give short course of Lasix to get some of the fluid off.  Not with ambulation.        Final Clinical Impression(s) / ED Diagnoses Final diagnoses:  Hypertension, unspecified type    Rx / DC Orders ED Discharge Orders          Ordered    furosemide (LASIX) 20 MG tablet  Daily        04/12/23 1820              Benjiman Core, MD 04/12/23 2340

## 2023-04-12 NOTE — ED Notes (Signed)
Pt walked in the hallway, O2 sat stayed between 95-97. Denies dizziness and shortness of breath.

## 2023-04-12 NOTE — ED Notes (Signed)
Patient transported to X-ray 

## 2023-04-12 NOTE — ED Triage Notes (Signed)
Patient presents to ED via POV from home. Here with shortness of breath and dizziness. Reports left total knee replacement in August.

## 2023-04-17 ENCOUNTER — Emergency Department (HOSPITAL_BASED_OUTPATIENT_CLINIC_OR_DEPARTMENT_OTHER): Payer: Medicare HMO

## 2023-04-17 ENCOUNTER — Encounter (HOSPITAL_BASED_OUTPATIENT_CLINIC_OR_DEPARTMENT_OTHER): Payer: Self-pay | Admitting: Radiology

## 2023-04-17 ENCOUNTER — Other Ambulatory Visit: Payer: Self-pay

## 2023-04-17 ENCOUNTER — Emergency Department (HOSPITAL_BASED_OUTPATIENT_CLINIC_OR_DEPARTMENT_OTHER)
Admission: EM | Admit: 2023-04-17 | Discharge: 2023-04-17 | Disposition: A | Discharge status: Home / Self Care | Payer: Medicare HMO | Attending: Emergency Medicine | Admitting: Emergency Medicine

## 2023-04-17 DIAGNOSIS — R55 Syncope and collapse: Secondary | ICD-10-CM | POA: Insufficient documentation

## 2023-04-17 DIAGNOSIS — R0602 Shortness of breath: Secondary | ICD-10-CM | POA: Diagnosis present

## 2023-04-17 DIAGNOSIS — Z20822 Contact with and (suspected) exposure to covid-19: Secondary | ICD-10-CM | POA: Diagnosis not present

## 2023-04-17 DIAGNOSIS — I10 Essential (primary) hypertension: Secondary | ICD-10-CM

## 2023-04-17 DIAGNOSIS — Z79899 Other long term (current) drug therapy: Secondary | ICD-10-CM | POA: Insufficient documentation

## 2023-04-17 DIAGNOSIS — Z7902 Long term (current) use of antithrombotics/antiplatelets: Secondary | ICD-10-CM | POA: Diagnosis not present

## 2023-04-17 LAB — RESP PANEL BY RT-PCR (RSV, FLU A&B, COVID)  RVPGX2
Influenza A by PCR: NEGATIVE
Influenza B by PCR: NEGATIVE
Resp Syncytial Virus by PCR: NEGATIVE
SARS Coronavirus 2 by RT PCR: NEGATIVE

## 2023-04-17 LAB — CBC WITH DIFFERENTIAL/PLATELET
Abs Immature Granulocytes: 0.01 10*3/uL (ref 0.00–0.07)
Basophils Absolute: 0.1 10*3/uL (ref 0.0–0.1)
Basophils Relative: 1 %
Eosinophils Absolute: 0.1 10*3/uL (ref 0.0–0.5)
Eosinophils Relative: 1 %
HCT: 35.7 % — ABNORMAL LOW (ref 36.0–46.0)
Hemoglobin: 11.6 g/dL — ABNORMAL LOW (ref 12.0–15.0)
Immature Granulocytes: 0 %
Lymphocytes Relative: 16 %
Lymphs Abs: 1.1 10*3/uL (ref 0.7–4.0)
MCH: 31.3 pg (ref 26.0–34.0)
MCHC: 32.5 g/dL (ref 30.0–36.0)
MCV: 96.2 fL (ref 80.0–100.0)
Monocytes Absolute: 0.6 10*3/uL (ref 0.1–1.0)
Monocytes Relative: 9 %
Neutro Abs: 5.1 10*3/uL (ref 1.7–7.7)
Neutrophils Relative %: 73 %
Platelets: 339 10*3/uL (ref 150–400)
RBC: 3.71 MIL/uL — ABNORMAL LOW (ref 3.87–5.11)
RDW: 13.5 % (ref 11.5–15.5)
WBC: 7 10*3/uL (ref 4.0–10.5)
nRBC: 0 % (ref 0.0–0.2)

## 2023-04-17 LAB — BASIC METABOLIC PANEL
Anion gap: 9 (ref 5–15)
BUN: 22 mg/dL (ref 8–23)
CO2: 27 mmol/L (ref 22–32)
Calcium: 8.8 mg/dL — ABNORMAL LOW (ref 8.9–10.3)
Chloride: 101 mmol/L (ref 98–111)
Creatinine, Ser: 0.87 mg/dL (ref 0.44–1.00)
GFR, Estimated: >60 mL/min (ref >60)
Glucose, Bld: 111 mg/dL — ABNORMAL HIGH (ref 70–99)
Potassium: 3.3 mmol/L — ABNORMAL LOW (ref 3.5–5.1)
Sodium: 137 mmol/L (ref 135–145)

## 2023-04-17 LAB — BRAIN NATRIURETIC PEPTIDE: B Natriuretic Peptide: 536.4 pg/mL — ABNORMAL HIGH (ref 0.0–100.0)

## 2023-04-17 LAB — D-DIMER, QUANTITATIVE: D-Dimer, Quant: 5.23 ug/mL-FEU — ABNORMAL HIGH (ref 0.00–0.50)

## 2023-04-17 LAB — TROPONIN I (HIGH SENSITIVITY): Troponin I (High Sensitivity): 11 ng/L (ref <18)

## 2023-04-17 MED ORDER — AMLODIPINE BESYLATE 5 MG PO TABS
10.0000 mg | ORAL_TABLET | Freq: Once | ORAL | Status: AC
  Administered 2023-04-17: 10 mg via ORAL
  Filled 2023-04-17: qty 2

## 2023-04-17 MED ORDER — IOHEXOL 350 MG/ML SOLN
100.0000 mL | Freq: Once | INTRAVENOUS | Status: AC | PRN
  Administered 2023-04-17: 75 mL via INTRAVENOUS

## 2023-04-17 MED ORDER — METOPROLOL TARTRATE 25 MG PO TABS
25.0000 mg | ORAL_TABLET | Freq: Once | ORAL | Status: AC
  Administered 2023-04-17: 25 mg via ORAL
  Filled 2023-04-17: qty 1

## 2023-04-17 MED ORDER — HYDRALAZINE HCL 20 MG/ML IJ SOLN
5.0000 mg | Freq: Once | INTRAMUSCULAR | Status: AC
  Administered 2023-04-17: 5 mg via INTRAVENOUS
  Filled 2023-04-17: qty 1

## 2023-04-17 MED ORDER — LOSARTAN POTASSIUM 25 MG PO TABS
100.0000 mg | ORAL_TABLET | Freq: Once | ORAL | Status: AC
  Administered 2023-04-17: 100 mg via ORAL
  Filled 2023-04-17: qty 4

## 2023-04-17 MED ORDER — LOSARTAN POTASSIUM 100 MG PO TABS
100.0000 mg | ORAL_TABLET | Freq: Every day | ORAL | 1 refills | Status: AC
Start: 2023-04-17 — End: 2023-05-17

## 2023-04-17 NOTE — Discharge Instructions (Addendum)
Follow-up with your primary care doctor later this week as you already have scheduled. Please restart your losartan 100 mg once per day. If you experience dizziness, lightheadedness, passing out, chest pain ,shortness of breath, please return to the ED. Please do not take any other medicine ending in -sartan.

## 2023-04-17 NOTE — ED Provider Notes (Signed)
[facility] Provider Note   CSN: [id number] Arrival date & time: 04/17/23  1141     History  Chief Complaint  Patient presents with   Shortness of Breath    Gina Stewart is a 89 y.o. female.  Patient here with some shortness of breath, near syncope.  Feels dehydrated but not sure.  Increase Lasix here recently for similar symptoms.  Supposed to follow-up with primary care later this week.  She denies any chest pain or any active symptoms now.  She had a brief episode of shortness of breath and lightheadedness that mostly resolved.  She denies any black or bloody stools.  Denies any fever cough sputum production.  Denies any weakness numbness tingling headaches fever chills.  The history is provided by the patient.       Home Medications Prior to Admission medications   Medication Sig Start Date End Date Taking? Authorizing Provider  amLODipine (NORVASC) 10 MG tablet Take 10 mg by mouth daily.    [provider]  Biotin 10 MG TABS Take 10 mg by mouth daily. 06/27/11   [provider]  Cholecalciferol (VITAMIN D-1000 MAX ST) 25 MCG (1000 UT) tablet Take 1,000 Units by mouth daily.    [provider]  clopidogrel (PLAVIX) 75 MG tablet Take 1 tablet (75 mg total) by mouth daily. 11/07/22   Pricilla Loveless, MD  famotidine (PEPCID) 20 MG tablet Take 20 mg by mouth daily.    [provider]  furosemide (LASIX) 20 MG tablet Take 1 tablet (20 mg total) by mouth daily. 04/12/23   Benjiman Core, MD  gabapentin (NEURONTIN) 100 MG capsule Take 1-2 capsules twice daily. 01/10/17   [provider]  HYDROcodone-acetaminophen (NORCO) 10-325 MG tablet Take 1 tablet by mouth every 6 (six) hours as needed for severe pain.    [provider]  ipratropium (ATROVENT) 0.06 % nasal spray Place 1 spray into both nostrils as needed. 06/08/22   [provider]  levothyroxine (SYNTHROID,  LEVOTHROID) 75 MCG tablet Take 75 mcg by mouth daily before breakfast.    [provider]  LOKELMA 10 g PACK packet SMARTSIG:1 Packet(s) By Mouth 3 Times a Week    [provider]  losartan (COZAAR) 100 MG tablet Take 100 mg by mouth daily.    [provider]  Magnesium Gluconate 550 MG TABS Take 30 mg by mouth at bedtime.    [provider]  metoprolol tartrate (LOPRESSOR) 25 MG tablet Take 25 mg by mouth 2 (two) times daily.    [provider]  REPATHA SURECLICK 140 MG/ML SOAJ Inject 140 mg into the skin every 14 (fourteen) days. 06/12/22   [provider]  rosuvastatin (CRESTOR) 20 MG tablet Take 1 tablet (20 mg total) by mouth daily. 03/27/22   Ghimire, Werner Lean, MD  salicylic acid 17 % gel Apply topically daily. To wart 03/09/23   Lonell Grandchild, MD  Specialty Vitamins Products (ICAPS LUTEIN & ZEAXANTHIN PO) Take 1 tablet by mouth in the morning and at bedtime.    [provider]  triamcinolone acetonide (KENALOG-40) 40 MG/ML injection Inject 40 mg into the articular space once. Given at clinic 07/26/22   [provider]      Allergies    Acrylic polymer [carbomer], Atorvastatin calcium, Ceclor [cefaclor], Cephalosporins, Metronidazole, Morphine and codeine, Other, Penicillins, Septra [sulfamethoxazole-trimethoprim], and Tramadol    Review of Systems   Review of Systems  Physical Exam  Updated Vital Signs BP (!) 135/92   Pulse (!) 58   Temp (!) 97.5 F (36.4 C) (Oral)   Resp 19   Ht 4\' 11"  (1.499 m)   Wt 50.3 kg   SpO2 100%   BMI 22.42 kg/m  Physical Exam Vitals and nursing note reviewed.  Constitutional:      General: She is not in acute distress.    Appearance: She is well-developed.  HENT:     Head: Normocephalic and atraumatic.     Mouth/Throat:     Mouth: Mucous membranes are moist.  Eyes:     Extraocular Movements: Extraocular movements intact.     Conjunctiva/sclera: Conjunctivae normal.      Pupils: Pupils are equal, round, and reactive to light.  Cardiovascular:     Rate and Rhythm: Normal rate and regular rhythm.     Pulses: Normal pulses.     Heart sounds: Normal heart sounds. No murmur heard. Pulmonary:     Effort: Pulmonary effort is normal. No respiratory distress.     Breath sounds: Normal breath sounds. No decreased breath sounds or wheezing.  Abdominal:     Palpations: Abdomen is soft.     Tenderness: There is no abdominal tenderness.  Musculoskeletal:        General: No swelling.     Cervical back: Normal range of motion and neck supple.     Right lower leg: No edema.     Left lower leg: No edema.  Skin:    General: Skin is warm and dry.     Capillary Refill: Capillary refill takes less than 2 seconds.  Neurological:     Mental Status: She is alert.  Psychiatric:        Mood and Affect: Mood normal.     ED Results / Procedures / Treatments   Labs (all labs ordered are listed, but only abnormal results are displayed) Labs Reviewed  CBC WITH DIFFERENTIAL/PLATELET - Abnormal; Notable for the following components:      Result Value   RBC 3.71 (*)    Hemoglobin 11.6 (*)    HCT 35.7 (*)    All other components within normal limits  BASIC METABOLIC PANEL - Abnormal; Notable for the following components:   Potassium 3.3 (*)    Glucose, Bld 111 (*)    Calcium 8.8 (*)    All other components within normal limits  BRAIN NATRIURETIC PEPTIDE - Abnormal; Notable for the following components:   B Natriuretic Peptide 536.4 (*)    All other components within normal limits  D-DIMER, QUANTITATIVE - Abnormal; Notable for the following components:   D-Dimer, Quant 5.23 (*)    All other components within normal limits  RESP PANEL BY RT-PCR (RSV, FLU A&B, COVID)  [id number]  TROPONIN I (HIGH SENSITIVITY)    EKG EKG Interpretation Date/Time:  Wednesday April 17 2023 11:53:13 EDT Ventricular Rate:  62 PR Interval:  123 QRS Duration:  78 QT Interval:  474 QTC  Calculation: 482 R Axis:   67  Text Interpretation: Sinus rhythm Probable anteroseptal infarct, old Confirmed by Virgina Norfolk [phone]) on 04/17/2023 12:04:46 PM  Radiology DG Chest Portable 1 View  Result Date: 04/17/2023 CLINICAL DATA:  Shortness of breath. EXAM: PORTABLE CHEST 1 VIEW COMPARISON:  April 12, 2023. FINDINGS: The heart size and mediastinal contours are within normal limits. Both lungs are clear. The visualized skeletal structures are unremarkable. IMPRESSION: No active disease. Electronically Signed   By: Zenda Alpers.D.  On: 04/17/2023 14:02    Procedures Procedures    Medications Ordered in ED Medications - No data to display  ED Course/ Medical Decision Making/ A&P                                 Medical Decision Making Amount and/or Complexity of Data Reviewed Labs: ordered. Radiology: ordered.   Elon Alas Reich is here with shortness of breath, near syncope.  History of hypertension, high cholesterol, cancer.  She is fairly asymptomatic now.  Was seen here a few days ago for the same with unremarkable workup.  Vital signs are unremarkable blood pressure is 135/92 on my evaluation.  EKG shows sinus rhythm.  No ischemic changes.  Differential diagnosis could be vasovagal type process versus mild dehydration as she did have Lasix increased for a few days, seems less likely to be ACS or PE or infectious process but will check CBC BMP troponin D-dimer chest x-ray.  D-dimer is elevated we will get a CT PE scan.  Work otherwise shows no significant anemia or electrolyte abnormality or kidney injury.  No significant leukocytosis.  Troponin 11.  BNP improved to 536.  COVID and flu test negative.  Overall have no concern for ACS.  No obvious pneumonia on chest x-ray per my review and interpretation.  Overall awaiting CT scan of the chest to further evaluate.  If unremarkable anticipate discharge to home.  Patient handed off to oncoming ED staff and patient pending  CT scan of chest.  Please see their note for further results, evaluation, disposition of the patient.  This chart was dictated using voice recognition software.  Despite best efforts to proofread,  errors can occur which can change the documentation meaning.         Final Clinical Impression(s) / ED Diagnoses Final diagnoses:  Shortness of breath    Rx / DC Orders ED Discharge Orders     None         Virgina Norfolk, DO 04/17/23 1429

## 2023-04-17 NOTE — ED Notes (Signed)
ED Provider at bedside. 

## 2023-04-17 NOTE — ED Provider Notes (Signed)
9:55 PM Assumed care of patient from off-going team. For more details, please see note from same day.  In brief, this is a 89 y.o. female Near syncope, SOB, nonspecific symptoms Recently had increased lasix and BNP improved Dimer was elevated Has PCP f/u on Friday  Plan/Dispo at time of sign-out & ED Course since sign-out: [ ]  PE scan  BP (!) 167/60   Pulse 64   Temp 98 F (36.7 C) (Oral)   Resp 12   Ht 4\' 11"  (1.499 m)   Wt 50.3 kg   SpO2 97%   BMI 22.42 kg/m    ED Course:   Clinical Course as of 04/17/23 2155  Wed Apr 17, 2023  1704 CT Angio Chest PE W and/or Wo Contrast No CT PE [HN]  1724 Went to discuss with patient about discharge, which she states she feels well and is ready to be discharged, but patient's BP has climbed to >220 systolic. She states that she used to take losartan 100 mg in addition to her metoprolol tartrate and amlodipine 10 mg. She states she is not currently taking losartan. On attempted chart review I cannot determine whether she was discontinued from losartan or if she simply ran out of refills, and patient is not sure either. [HN]  1806 On chart reivew I cannot find that her losartan was discontinued. Most recently it is listed as a current medication at a cardiology visit on 03/12/2023.  I have a high suspicion that given her blood pressure now, patient is actually supposed to be taking the losartan. [HN]  2120 BP(!): 167/60 Decreased BP after 5 mg IV hydralazine. D/w patient and will re-instate her losartan 100 mg every day. She has f/u later this week and can discuss her BP treatment then. Will prescribe this and she is given rx for losartan. She feels well and is ready to be discharged. DC w/ discharge instructions/return precautions. All questions answered to patient's satisfaction.  [HN]    Clinical Course User Index [HN] Loetta Rough, MD   ------------------------------- Vivi Barrack, MD Emergency Medicine  This note was created using  dictation software, which may contain spelling or grammatical errors.   Loetta Rough, MD 04/17/23 2156

## 2023-04-17 NOTE — ED Triage Notes (Signed)
C/O shortness of breath and near syncope earlier today. 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

## 2023-04-17 NOTE — ED Notes (Signed)
X-Ray at bedside.

## 2023-04-21 ENCOUNTER — Emergency Department (HOSPITAL_BASED_OUTPATIENT_CLINIC_OR_DEPARTMENT_OTHER): Payer: Medicare HMO

## 2023-04-21 ENCOUNTER — Encounter (HOSPITAL_BASED_OUTPATIENT_CLINIC_OR_DEPARTMENT_OTHER): Payer: Self-pay | Admitting: Urology

## 2023-04-21 ENCOUNTER — Emergency Department (HOSPITAL_BASED_OUTPATIENT_CLINIC_OR_DEPARTMENT_OTHER)
Admission: EM | Admit: 2023-04-21 | Discharge: 2023-04-21 | Disposition: A | Discharge status: Home / Self Care | Payer: Medicare HMO | Attending: Emergency Medicine | Admitting: Emergency Medicine

## 2023-04-21 DIAGNOSIS — I10 Essential (primary) hypertension: Secondary | ICD-10-CM | POA: Diagnosis present

## 2023-04-21 DIAGNOSIS — Z7902 Long term (current) use of antithrombotics/antiplatelets: Secondary | ICD-10-CM | POA: Insufficient documentation

## 2023-04-21 DIAGNOSIS — Z79899 Other long term (current) drug therapy: Secondary | ICD-10-CM | POA: Diagnosis not present

## 2023-04-21 LAB — CBC WITH DIFFERENTIAL/PLATELET
Abs Immature Granulocytes: 0.02 10*3/uL (ref 0.00–0.07)
Basophils Absolute: 0.1 10*3/uL (ref 0.0–0.1)
Basophils Relative: 1 %
Eosinophils Absolute: 0.2 10*3/uL (ref 0.0–0.5)
Eosinophils Relative: 2 %
HCT: 37.8 % (ref 36.0–46.0)
Hemoglobin: 12.1 g/dL (ref 12.0–15.0)
Immature Granulocytes: 0 %
Lymphocytes Relative: 21 %
Lymphs Abs: 1.7 10*3/uL (ref 0.7–4.0)
MCH: 30.8 pg (ref 26.0–34.0)
MCHC: 32 g/dL (ref 30.0–36.0)
MCV: 96.2 fL (ref 80.0–100.0)
Monocytes Absolute: 0.7 10*3/uL (ref 0.1–1.0)
Monocytes Relative: 9 %
Neutro Abs: 5.3 10*3/uL (ref 1.7–7.7)
Neutrophils Relative %: 67 %
Platelets: 346 10*3/uL (ref 150–400)
RBC: 3.93 MIL/uL (ref 3.87–5.11)
RDW: 13.4 % (ref 11.5–15.5)
WBC: 7.9 10*3/uL (ref 4.0–10.5)
nRBC: 0 % (ref 0.0–0.2)

## 2023-04-21 LAB — COMPREHENSIVE METABOLIC PANEL
ALT: 14 U/L (ref 0–44)
AST: 23 U/L (ref 15–41)
Albumin: 3.8 g/dL (ref 3.5–5.0)
Alkaline Phosphatase: 85 U/L (ref 38–126)
Anion gap: 9 (ref 5–15)
BUN: 19 mg/dL (ref 8–23)
CO2: 26 mmol/L (ref 22–32)
Calcium: 9 mg/dL (ref 8.9–10.3)
Chloride: 101 mmol/L (ref 98–111)
Creatinine, Ser: 0.84 mg/dL (ref 0.44–1.00)
GFR, Estimated: >60 mL/min (ref >60)
Glucose, Bld: 115 mg/dL — ABNORMAL HIGH (ref 70–99)
Potassium: 3.7 mmol/L (ref 3.5–5.1)
Sodium: 136 mmol/L (ref 135–145)
Total Bilirubin: 0.5 mg/dL (ref 0.3–1.2)
Total Protein: 7.1 g/dL (ref 6.5–8.1)

## 2023-04-21 LAB — TROPONIN I (HIGH SENSITIVITY): Troponin I (High Sensitivity): 11 ng/L (ref <18)

## 2023-04-21 NOTE — ED Provider Notes (Signed)
[facility] Provider Note   CSN: [id number] Arrival date & time: 04/21/23  1158     History  Chief Complaint  Patient presents with   Hypertension    Gina Stewart is a 89 y.o. female.  89 yo F with a chief complaints of hypertension.  She tells me that this is her third ED visit in a couple weeks for the same.  She is upset because the number does not seem to go down.  She has felt a bit weak over the past few weeks.  She denies cough congestion or fever denies chest pain.  She has had some shortness of breath she thinks.  Denies headaches denies one-sided numbness or weakness denies difficulty speech or swallowing.   Hypertension       Home Medications Prior to Admission medications   Medication Sig Start Date End Date Taking? Authorizing Provider  amLODipine (NORVASC) 10 MG tablet Take 10 mg by mouth daily.    [provider]  Biotin 10 MG TABS Take 10 mg by mouth daily. 06/27/11   [provider]  Cholecalciferol (VITAMIN D-1000 MAX ST) 25 MCG (1000 UT) tablet Take 1,000 Units by mouth daily.    [provider]  clopidogrel (PLAVIX) 75 MG tablet Take 1 tablet (75 mg total) by mouth daily. 11/07/22   Pricilla Loveless, MD  famotidine (PEPCID) 20 MG tablet Take 20 mg by mouth daily.    [provider]  furosemide (LASIX) 20 MG tablet Take 1 tablet (20 mg total) by mouth daily. 04/12/23   Benjiman Core, MD  gabapentin (NEURONTIN) 100 MG capsule Take 1-2 capsules twice daily. 01/10/17   [provider]  HYDROcodone-acetaminophen (NORCO) 10-325 MG tablet Take 1 tablet by mouth every 6 (six) hours as needed for severe pain.    [provider]  ipratropium (ATROVENT) 0.06 % nasal spray Place 1 spray into both nostrils as needed. 06/08/22   [provider]  levothyroxine (SYNTHROID, LEVOTHROID) 75 MCG tablet Take 75 mcg by mouth daily before breakfast.    [provider]  LOKELMA 10 g PACK packet SMARTSIG:1 Packet(s) By Mouth 3 Times a Week    [provider]  losartan (COZAAR) 100 MG tablet Take 1 tablet (100 mg total) by mouth daily. 04/17/23 05/17/23  Loetta Rough, MD  Magnesium Gluconate 550 MG TABS Take 30 mg by mouth at bedtime.    [provider]  metoprolol tartrate (LOPRESSOR) 25 MG tablet Take 25 mg by mouth 2 (two) times daily.    [provider]  REPATHA SURECLICK 140 MG/ML SOAJ Inject 140 mg into the skin every 14 (fourteen) days. 06/12/22   [provider]  rosuvastatin (CRESTOR) 20 MG tablet Take 1 tablet (20 mg total) by mouth daily. 03/27/22   Ghimire, Werner Lean, MD  salicylic acid 17 % gel Apply topically daily. To wart 03/09/23   Lonell Grandchild, MD  Specialty Vitamins Products (ICAPS LUTEIN & ZEAXANTHIN PO) Take 1 tablet by mouth in the morning and at bedtime.    [provider]  triamcinolone acetonide (KENALOG-40) 40 MG/ML injection Inject 40 mg into the articular space once. Given at clinic 07/26/22   [provider]      Allergies    Acrylic polymer [carbomer], Atorvastatin calcium, Ceclor [cefaclor], Cephalosporins, Metronidazole, Morphine and codeine, Other, Penicillins, Septra [sulfamethoxazole-trimethoprim], and Tramadol    Review of Systems   Review of Systems  Physical Exam Updated  Vital Signs BP (!) 221/94   Pulse 65   Temp 98.4 F (36.9 C) (Oral)   Resp 20   Ht 4\' 11"  (1.499 m)   Wt 50.3 kg   SpO2 99%   BMI 22.40 kg/m  Physical Exam Vitals and nursing note reviewed.  Constitutional:      General: She is not in acute distress.    Appearance: She is well-developed. She is not diaphoretic.  HENT:     Head: Normocephalic and atraumatic.  Eyes:     Pupils: Pupils are equal, round, and reactive to light.  Cardiovascular:     Rate and Rhythm: Normal rate and regular rhythm.     Heart sounds: No murmur heard.    No friction rub. No gallop.   Pulmonary:     Effort: Pulmonary effort is normal.     Breath sounds: No wheezing or rales.  Abdominal:     General: There is no distension.     Palpations: Abdomen is soft.     Tenderness: There is no abdominal tenderness.  Musculoskeletal:        General: No tenderness.     Cervical back: Normal range of motion and neck supple.  Skin:    General: Skin is warm and dry.  Neurological:     Mental Status: She is alert and oriented to person, place, and time.     GCS: GCS eye subscore is 4. GCS verbal subscore is 5. GCS motor subscore is 6.     Cranial Nerves: Cranial nerves 2-12 are intact.     Sensory: Sensation is intact.     Motor: Motor function is intact.     Coordination: Coordination is intact.  Psychiatric:        Behavior: Behavior normal.     ED Results / Procedures / Treatments   Labs (all labs ordered are listed, but only abnormal results are displayed) Labs Reviewed  COMPREHENSIVE METABOLIC PANEL - Abnormal; Notable for the following components:      Result Value   Glucose, Bld 115 (*)    All other components within normal limits  CBC WITH DIFFERENTIAL/PLATELET  TROPONIN I (HIGH SENSITIVITY)    EKG EKG Interpretation Date/Time:  Sunday April 21 2023 12:16:55 EDT Ventricular Rate:  62 PR Interval:  143 QRS Duration:  77 QT Interval:  443 QTC Calculation: 450 R Axis:   73  Text Interpretation: Sinus rhythm Consider left ventricular hypertrophy Anterior Q waves, possibly due to LVH No significant change since last tracing Confirmed by Melene Plan [phone]) on 04/21/2023 12:54:10 PM  Radiology DG Chest Port 1 View  Result Date: 04/21/2023 CLINICAL DATA:  Shortness of breath, hypertension EXAM: PORTABLE CHEST 1 VIEW COMPARISON:  04/17/2023 FINDINGS: Cardiomegaly. Both lungs are clear. The visualized skeletal structures are unremarkable. IMPRESSION: Cardiomegaly without acute abnormality of the lungs in AP portable projection. Electronically Signed   By: Jearld Lesch M.D.   On: 04/21/2023 13:27    Procedures Procedures    Medications Ordered in ED Medications - No data to display  ED Course/ Medical Decision Making/ A&P                                 Medical Decision Making Amount and/or Complexity of Data Reviewed Labs: ordered. Radiology: ordered.   89 yo F with a cc of fatigue and shortness of breath for the past couple weeks.  She is mostly concerned  about her blood pressure being elevated.  Was told by her PCP that if her blood pressure did not come down she should come to the ER.  On my record review this is actually her third visits in a week.  She has had serial troponins had a CT angiogram of the chest without obvious organic cause.  I am not sure why her family doctor keeps referring her to the ER.  My understanding of the research as it would be of benefit to have her blood pressure acutely lowered in the setting.  As she still feels fatigued I will recheck a singular troponin blood work.  Patient's troponin is negative, chest x-ray independently interpreted by me without focal infiltrate or pneumothorax.  She has no anemia.  No significant electrolyte abnormalities.  Feel this is unlikely to be a Hypertensive Emergency and recent studies suggest no benefit for inpatient admission.  There are also no studies to my knowledge suggesting that patients with hypertensive urgency have increased risk for end organ disease. In fact there has been a study recently that would suggest that the rapid change can induce harm.  The [employer] policy statement on asymptomatic hypertension does not  recommend routing ED medical intervention. The patient will follow up closely with their PCP.  Compliance with their medication stressed.   The management of elevated blood pressure in the acute care setting: a scientific statement from the American Heart Association Bress AP, Jena Gauss, Flack JM, et al. Hypertension. May  2024. doi: 10.1161/HYP.0000000000000238  Chester Holstein, Christell Constant EH, et al. Characteristics and outcomes of patients presenting with hypertensive urgency in the office setting. JAMA Intern Med. 2016 Jul 1; 176(7): 981-8.   Cerebrovascular risks with rapid blood pressure lowering in the absence of hypertensive emergency Laverle Hobby, Ronalee Red, et al. Am J Emerg Med. 2019;37(6):1073-1077.          Final Clinical Impression(s) / ED Diagnoses Final diagnoses:  Uncontrolled hypertension    Rx / DC Orders ED Discharge Orders     None         Melene Plan, DO 04/21/23 1341

## 2023-04-21 NOTE — Discharge Instructions (Addendum)
Your workup here did not show that you are having a heart attack or pneumonia or collapsed lung.  Your hemoglobin is unchanged from previous.  Please follow-up with your family doctor in the office.  As we discussed they need to slowly lower your blood pressure over time.  Please return for sudden worsening difficulty breathing sudden headache one-sided numbness or weakness or difficulty speech or swallowing.

## 2023-04-21 NOTE — ED Triage Notes (Signed)
Pt states BP high since Friday, was 196/88 at home this am  States slight SOB noted   Took all BP meds this am

## 2023-04-21 NOTE — ED Notes (Signed)
ED Provider at bedside. 

## 2023-04-21 NOTE — ED Notes (Signed)
States BP med mix up, was unable to get losartan was put on different bp med

## 2024-02-01 ENCOUNTER — Emergency Department (HOSPITAL_BASED_OUTPATIENT_CLINIC_OR_DEPARTMENT_OTHER)
Admission: EM | Admit: 2024-02-01 | Discharge: 2024-02-01 | Disposition: A | Discharge status: Home / Self Care | Attending: Emergency Medicine | Admitting: Emergency Medicine

## 2024-02-01 ENCOUNTER — Emergency Department (HOSPITAL_BASED_OUTPATIENT_CLINIC_OR_DEPARTMENT_OTHER)

## 2024-02-01 ENCOUNTER — Encounter (HOSPITAL_BASED_OUTPATIENT_CLINIC_OR_DEPARTMENT_OTHER): Payer: Self-pay

## 2024-02-01 DIAGNOSIS — Z7902 Long term (current) use of antithrombotics/antiplatelets: Secondary | ICD-10-CM | POA: Diagnosis not present

## 2024-02-01 DIAGNOSIS — R030 Elevated blood-pressure reading, without diagnosis of hypertension: Secondary | ICD-10-CM | POA: Insufficient documentation

## 2024-02-01 DIAGNOSIS — R197 Diarrhea, unspecified: Secondary | ICD-10-CM | POA: Diagnosis present

## 2024-02-01 DIAGNOSIS — R0602 Shortness of breath: Secondary | ICD-10-CM | POA: Diagnosis not present

## 2024-02-01 DIAGNOSIS — R7989 Other specified abnormal findings of blood chemistry: Secondary | ICD-10-CM | POA: Insufficient documentation

## 2024-02-01 DIAGNOSIS — R5383 Other fatigue: Secondary | ICD-10-CM | POA: Diagnosis not present

## 2024-02-01 DIAGNOSIS — A09 Infectious gastroenteritis and colitis, unspecified: Secondary | ICD-10-CM

## 2024-02-01 LAB — CBC WITH DIFFERENTIAL/PLATELET
Abs Immature Granulocytes: 0.03 K/uL (ref 0.00–0.07)
Basophils Absolute: 0.1 K/uL (ref 0.0–0.1)
Basophils Relative: 1 %
Eosinophils Absolute: 0.1 K/uL (ref 0.0–0.5)
Eosinophils Relative: 1 %
HCT: 38.1 % (ref 36.0–46.0)
Hemoglobin: 12.6 g/dL (ref 12.0–15.0)
Immature Granulocytes: 0 %
Lymphocytes Relative: 20 %
Lymphs Abs: 2.3 K/uL (ref 0.7–4.0)
MCH: 30.8 pg (ref 26.0–34.0)
MCHC: 33.1 g/dL (ref 30.0–36.0)
MCV: 93.2 fL (ref 80.0–100.0)
Monocytes Absolute: 1.1 K/uL — ABNORMAL HIGH (ref 0.1–1.0)
Monocytes Relative: 9 %
Neutro Abs: 8 K/uL — ABNORMAL HIGH (ref 1.7–7.7)
Neutrophils Relative %: 69 %
Platelets: 359 K/uL (ref 150–400)
RBC: 4.09 MIL/uL (ref 3.87–5.11)
RDW: 13.6 % (ref 11.5–15.5)
WBC: 11.5 K/uL — ABNORMAL HIGH (ref 4.0–10.5)
nRBC: 0 % (ref 0.0–0.2)

## 2024-02-01 LAB — URINALYSIS, ROUTINE W REFLEX MICROSCOPIC
Bilirubin Urine: NEGATIVE
Glucose, UA: NEGATIVE mg/dL
Hgb urine dipstick: NEGATIVE
Ketones, ur: NEGATIVE mg/dL
Nitrite: NEGATIVE
Protein, ur: 100 mg/dL — AB
Specific Gravity, Urine: 1.015 (ref 1.005–1.030)
pH: 7 (ref 5.0–8.0)

## 2024-02-01 LAB — COMPREHENSIVE METABOLIC PANEL WITH GFR
ALT: 13 U/L (ref 0–44)
AST: 19 U/L (ref 15–41)
Albumin: 4.5 g/dL (ref 3.5–5.0)
Alkaline Phosphatase: 100 U/L (ref 38–126)
Anion gap: 14 (ref 5–15)
BUN: 22 mg/dL (ref 8–23)
CO2: 24 mmol/L (ref 22–32)
Calcium: 9.4 mg/dL (ref 8.9–10.3)
Chloride: 105 mmol/L (ref 98–111)
Creatinine, Ser: 0.67 mg/dL (ref 0.44–1.00)
GFR, Estimated: >60 mL/min (ref >60)
Glucose, Bld: 102 mg/dL — ABNORMAL HIGH (ref 70–99)
Potassium: 4.7 mmol/L (ref 3.5–5.1)
Sodium: 142 mmol/L (ref 135–145)
Total Bilirubin: 0.4 mg/dL (ref 0.0–1.2)
Total Protein: 7.4 g/dL (ref 6.5–8.1)

## 2024-02-01 LAB — TROPONIN T, HIGH SENSITIVITY
Troponin T High Sensitivity: 21 ng/L — ABNORMAL HIGH (ref <19)
Troponin T High Sensitivity: <15 ng/L (ref <19)

## 2024-02-01 LAB — URINALYSIS, MICROSCOPIC (REFLEX)

## 2024-02-01 LAB — PRO BRAIN NATRIURETIC PEPTIDE: Pro Brain Natriuretic Peptide: 852 pg/mL — ABNORMAL HIGH (ref <300.0)

## 2024-02-01 MED ORDER — SODIUM CHLORIDE 0.9 % IV BOLUS
1000.0000 mL | Freq: Once | INTRAVENOUS | Status: AC
  Administered 2024-02-01: 1000 mL via INTRAVENOUS

## 2024-02-01 NOTE — Discharge Instructions (Addendum)
 Stable hydrated.  Return for persistent or worsening signs or symptoms.  Follow-up with primary doctor and cardiology to discuss echo and blood pressure.

## 2024-02-01 NOTE — ED Triage Notes (Signed)
 Pt reports feeling weak yesterday and diarrhea over the night. 4 yellow stools during the night Denies N/V or abdominal pain. Accompanied by POA who states caregiver had diarrhea also. Pt also reports SOB

## 2024-02-01 NOTE — ED Notes (Signed)
 Lab notified of troponin add-on.

## 2024-02-01 NOTE — ED Provider Notes (Signed)
  EMERGENCY DEPARTMENT AT [facility] Provider Note   CSN: [id number] Arrival date & time: 02/01/24  9182     Patient presents with: Shortness of Breath and Diarrhea   Gina Stewart is a 89 y.o. female.   Patient presents with feeling generally weak after multiple episodes of nonbloody diarrhea, yellow color since yesterday.  Caregiver had diarrhea recently.  Patient has mild shortness of breath not worse with lying flat.  No specific exertional symptoms.  Mild urinary frequency however she has been drinking increased water.  No focal weakness.  No chest pain or cough.  Patient's guardian in the room with her.  The history is provided by the patient.  Shortness of Breath Severity:  Mild Diarrhea      Prior to Admission medications   Medication Sig Start Date End Date Taking? Authorizing Provider  amLODipine  (NORVASC ) 10 MG tablet Take 10 mg by mouth daily.    [provider]  Biotin 10 MG TABS Take 10 mg by mouth daily. 06/27/11   [provider]  Cholecalciferol (VITAMIN D-1000 MAX ST) 25 MCG (1000 UT) tablet Take 1,000 Units by mouth daily.    [provider]  clopidogrel  (PLAVIX ) 75 MG tablet Take 1 tablet (75 mg total) by mouth daily. 11/07/22   Freddi Hamilton, MD  famotidine  (PEPCID ) 20 MG tablet Take 20 mg by mouth daily.    [provider]  furosemide  (LASIX ) 20 MG tablet Take 1 tablet (20 mg total) by mouth daily. 04/12/23   Patsey Lot, MD  gabapentin (NEURONTIN) 100 MG capsule Take 1-2 capsules twice daily. 01/10/17   [provider]  HYDROcodone-acetaminophen  (NORCO) 10-325 MG tablet Take 1 tablet by mouth every 6 (six) hours as needed for severe pain.    [provider]  ipratropium (ATROVENT) 0.06 % nasal spray Place 1 spray into both nostrils as needed. 06/08/22   [provider]  levothyroxine  (SYNTHROID , LEVOTHROID) 75 MCG tablet Take 75 mcg by mouth daily before breakfast.     [provider]  LOKELMA  10 g PACK packet SMARTSIG:1 Packet(s) By Mouth 3 Times a Week    [provider]  losartan  (COZAAR ) 100 MG tablet Take 1 tablet (100 mg total) by mouth daily. 04/17/23 05/17/23  Franklyn Sid SAILOR, MD  Magnesium Gluconate 550 MG TABS Take 30 mg by mouth at bedtime.    [provider]  metoprolol  tartrate (LOPRESSOR ) 25 MG tablet Take 25 mg by mouth 2 (two) times daily.    [provider]  REPATHA SURECLICK 140 MG/ML SOAJ Inject 140 mg into the skin every 14 (fourteen) days. 06/12/22   [provider]  rosuvastatin  (CRESTOR ) 20 MG tablet Take 1 tablet (20 mg total) by mouth daily. 03/27/22   Ghimire, Donalda HERO, MD  salicylic acid  17 % gel Apply topically daily. To wart 03/09/23   Francesca Elsie CROME, MD  Specialty Vitamins Products (ICAPS LUTEIN & ZEAXANTHIN PO) Take 1 tablet by mouth in the morning and at bedtime.    [provider]  triamcinolone acetonide (KENALOG-40) 40 MG/ML injection Inject 40 mg into the articular space once. Given at clinic 07/26/22   [provider]    Allergies: Acrylic polymer [carbomer], Atorvastatin calcium , Ceclor [cefaclor], Cephalosporins, Metronidazole, Morphine and codeine, Other, Penicillins, Septra [sulfamethoxazole-trimethoprim], and Tramadol    Review of Systems  Respiratory:  Positive for shortness of breath.   Gastrointestinal:  Positive for diarrhea.    Updated Vital Signs BP (!) 191/58  Pulse (!) 59   Temp 98.1 F (36.7 C) (Oral)   Resp 16   Wt 4.99 kg   SpO2 95%   BMI 2.22 kg/m   Physical Exam  (all labs ordered are listed, but only abnormal results are displayed) Labs Reviewed  COMPREHENSIVE METABOLIC PANEL WITH GFR - Abnormal; Notable for the following components:      Result Value   Glucose, Bld 102 (*)    All other components within normal limits  CBC WITH DIFFERENTIAL/PLATELET - Abnormal; Notable for the following components:   WBC 11.5 (*)    Neutro  Abs 8.0 (*)    Monocytes Absolute 1.1 (*)    All other components within normal limits  URINALYSIS, ROUTINE W REFLEX MICROSCOPIC - Abnormal; Notable for the following components:   Protein, ur 100 (*)    Leukocytes,Ua TRACE (*)    All other components within normal limits  PRO BRAIN NATRIURETIC PEPTIDE - Abnormal; Notable for the following components:   Pro Brain Natriuretic Peptide 852.0 (*)    All other components within normal limits  URINALYSIS, MICROSCOPIC (REFLEX) - Abnormal; Notable for the following components:   Bacteria, UA RARE (*)    All other components within normal limits  TROPONIN T, HIGH SENSITIVITY - Abnormal; Notable for the following components:   Troponin T High Sensitivity 21 (*)    All other components within normal limits  URINE CULTURE  TROPONIN T, HIGH SENSITIVITY    EKG: EKG Interpretation Date/Time:  Saturday February 01 2024 91:70:75 EDT Ventricular Rate:  57 PR Interval:  135 QRS Duration:  82 QT Interval:  428 QTC Calculation: 417 R Axis:   50  Text Interpretation: Sinus rhythm Anteroseptal infarct, old Baseline wander in lead(s) V1 Confirmed by Tonia Chew [phone]) on 02/01/2024 8:40:34 AM  Radiology: [facility] Chest Portable 1 View Result Date: 02/01/2024 CLINICAL DATA:  Shortness of breath and weakness. EXAM: PORTABLE CHEST 1 VIEW COMPARISON:  08/20/2023 FINDINGS: The lungs are clear without focal pneumonia, edema, pneumothorax or pleural effusion. The cardiopericardial silhouette is within normal limits for size. No acute bony abnormality. Telemetry leads overlie the chest. IMPRESSION: No active disease. Electronically Signed   By: Camellia Candle M.D.   On: 02/01/2024 09:22     Procedures   Medications Ordered in the ED  sodium chloride  0.9 % bolus 1,000 mL (0 mLs Intravenous Stopped 02/01/24 1005)                                    Medical Decision Making Amount and/or Complexity of Data Reviewed Labs: ordered. Radiology: ordered.   Patient  presents with primarily fatigue and recurrent diarrhea and signs of dehydration.  Plan for blood work electrolytes check metabolic aspect.  Patient also has elevated blood pressure 170s to 190s.  Lungs are clear no crackles.  BNP elevated 800, stopped fluid bolus of 500 cc.  Patient symptoms improved on reassessment no chest pain.  Troponin minimally elevated 21 and no acute chest pain.  Other electrolytes unremarkable, minimal white blood cell count elevation 11,000 and no signs of UTI on urinalysis reviewed.  Chest x-ray no significant cardiomegaly no obvious effusion reviewed.  Medical records reviewed September 2023 had echo with normal systolic ejection fraction 60%.  Patient improved and feels well on reassessment.  Blood pressure elevated however she admits they have been trying to control it outpatient.  No stroke symptoms or chest pain at  this time.  Patient stable to follow-up with cardiology in the office and primary doctor.       Final diagnoses:  Elevated brain natriuretic peptide (BNP) level  Elevated blood pressure reading  Diarrhea of infectious origin    ED Discharge Orders          Ordered    Ambulatory referral to Cardiology       Comments: If you have not heard from the Cardiology office within the next 72 hours please call [phone].   02/01/24 1130               Tonia Chew, MD 02/01/24 1324

## 2024-02-01 NOTE — ED Notes (Signed)
 Lab aware of urine culture add on.

## 2024-02-02 LAB — URINE CULTURE: Special Requests: NORMAL

## 2024-02-16 ENCOUNTER — Emergency Department (HOSPITAL_BASED_OUTPATIENT_CLINIC_OR_DEPARTMENT_OTHER)

## 2024-02-16 ENCOUNTER — Other Ambulatory Visit: Payer: Self-pay

## 2024-02-16 ENCOUNTER — Encounter (HOSPITAL_BASED_OUTPATIENT_CLINIC_OR_DEPARTMENT_OTHER): Payer: Self-pay | Admitting: Emergency Medicine

## 2024-02-16 ENCOUNTER — Emergency Department (HOSPITAL_BASED_OUTPATIENT_CLINIC_OR_DEPARTMENT_OTHER)
Admission: EM | Admit: 2024-02-16 | Discharge: 2024-02-16 | Disposition: A | Discharge status: Home / Self Care | Attending: Emergency Medicine | Admitting: Emergency Medicine

## 2024-02-16 DIAGNOSIS — R42 Dizziness and giddiness: Secondary | ICD-10-CM | POA: Diagnosis present

## 2024-02-16 DIAGNOSIS — R0600 Dyspnea, unspecified: Secondary | ICD-10-CM | POA: Diagnosis not present

## 2024-02-16 DIAGNOSIS — Z79899 Other long term (current) drug therapy: Secondary | ICD-10-CM | POA: Diagnosis not present

## 2024-02-16 DIAGNOSIS — I10 Essential (primary) hypertension: Secondary | ICD-10-CM | POA: Insufficient documentation

## 2024-02-16 DIAGNOSIS — Z859 Personal history of malignant neoplasm, unspecified: Secondary | ICD-10-CM | POA: Diagnosis not present

## 2024-02-16 LAB — COMPREHENSIVE METABOLIC PANEL WITH GFR
ALT: 12 U/L (ref 0–44)
AST: 17 U/L (ref 15–41)
Albumin: 4 g/dL (ref 3.5–5.0)
Alkaline Phosphatase: 66 U/L (ref 38–126)
Anion gap: 9 (ref 5–15)
BUN: 28 mg/dL — ABNORMAL HIGH (ref 8–23)
CO2: 24 mmol/L (ref 22–32)
Calcium: 9 mg/dL (ref 8.9–10.3)
Chloride: 107 mmol/L (ref 98–111)
Creatinine, Ser: 0.82 mg/dL (ref 0.44–1.00)
GFR, Estimated: >60 mL/min (ref >60)
Glucose, Bld: 81 mg/dL (ref 70–99)
Potassium: 4.8 mmol/L (ref 3.5–5.1)
Sodium: 140 mmol/L (ref 135–145)
Total Bilirubin: 0.2 mg/dL (ref 0.0–1.2)
Total Protein: 6.2 g/dL — ABNORMAL LOW (ref 6.5–8.1)

## 2024-02-16 LAB — URINALYSIS, ROUTINE W REFLEX MICROSCOPIC
Bilirubin Urine: NEGATIVE
Glucose, UA: NEGATIVE mg/dL
Hgb urine dipstick: NEGATIVE
Ketones, ur: NEGATIVE mg/dL
Nitrite: NEGATIVE
Protein, ur: NEGATIVE mg/dL
Specific Gravity, Urine: 1.015 (ref 1.005–1.030)
pH: 7 (ref 5.0–8.0)

## 2024-02-16 LAB — TROPONIN T, HIGH SENSITIVITY
Troponin T High Sensitivity: <15 ng/L (ref <19)
Troponin T High Sensitivity: <15 ng/L (ref <19)

## 2024-02-16 LAB — CBC
HCT: 32 % — ABNORMAL LOW (ref 36.0–46.0)
Hemoglobin: 10.6 g/dL — ABNORMAL LOW (ref 12.0–15.0)
MCH: 30.9 pg (ref 26.0–34.0)
MCHC: 33.1 g/dL (ref 30.0–36.0)
MCV: 93.3 fL (ref 80.0–100.0)
Platelets: 253 K/uL (ref 150–400)
RBC: 3.43 MIL/uL — ABNORMAL LOW (ref 3.87–5.11)
RDW: 13.8 % (ref 11.5–15.5)
WBC: 8.1 K/uL (ref 4.0–10.5)
nRBC: 0 % (ref 0.0–0.2)

## 2024-02-16 LAB — URINALYSIS, MICROSCOPIC (REFLEX)

## 2024-02-16 NOTE — ED Triage Notes (Addendum)
 C/o dizziness this morning. States it feels like her head is spinning. Denies pain. Denies weakness, visual changes. Endorses SOB

## 2024-02-16 NOTE — ED Notes (Signed)
 ED Provider at bedside.

## 2024-02-16 NOTE — ED Notes (Signed)
 Attempted IV start x 2, unsuccessful, another RN to bedside

## 2024-02-16 NOTE — Discharge Instructions (Addendum)
 Discontinue the Xyzal as the timing of starting this medication is suspicious for being associated with your symptoms.

## 2024-02-16 NOTE — ED Provider Notes (Signed)
 [place] [facility] AT [facility] Provider Note   CSN: [id number] Arrival date & time: 02/16/24  8367     Patient presents with: Dizziness   Gina Stewart is a 89 y.o. female.  {Add pertinent medical, surgical, social history, OB history to HPI:32947} HPI     History of hypertension, hyperlipidemia  Dizziness Felt ok this morning initially then started to feel lightehaded, and then dizzy and has felt that way all day Head is just dizzy, like things moving somewhat.  Sort of like might pass out.  Using walker now, had knee surgery in April Has not had dizziness like this before, has had it because of dehydration and thought it could be that but drank 3 20oz bottle of water with liquid iv for electrolytes but now just going to the bathroom a lot.  No headache No chest pain Mild dyspnea, this afternoon No cough, no fever, no nausea or vomiting, no black or bloody stools No numbness, weakness, change in vision, trouble talking, facial droop No urinary symptoms  Recently had an allergy pill she just started this week, not sure the name of it--levocetirizine, xyzal 5mg  every every evening 12.5mg  TID, then went to once a day and now twice a day   Past Medical History:  Diagnosis Date   Cancer (HCC)    GERD (gastroesophageal reflux disease)    High cholesterol    Hypertension    Thyroid disease      Prior to Admission medications   Medication Sig Start Date End Date Taking? Authorizing Provider  amLODipine  (NORVASC ) 10 MG tablet Take 10 mg by mouth daily.    [provider]  Biotin 10 MG TABS Take 10 mg by mouth daily. 06/27/11   [provider]  Cholecalciferol (VITAMIN D-1000 MAX ST) 25 MCG (1000 UT) tablet Take 1,000 Units by mouth daily.    [provider]  clopidogrel  (PLAVIX ) 75 MG tablet Take 1 tablet (75 mg total) by mouth daily. 11/07/22   Freddi Hamilton, MD  famotidine  (PEPCID ) 20 MG tablet Take 20 mg by mouth  daily.    [provider]  furosemide  (LASIX ) 20 MG tablet Take 1 tablet (20 mg total) by mouth daily. 04/12/23   Patsey Lot, MD  gabapentin (NEURONTIN) 100 MG capsule Take 1-2 capsules twice daily. 01/10/17   [provider]  HYDROcodone-acetaminophen  (NORCO) 10-325 MG tablet Take 1 tablet by mouth every 6 (six) hours as needed for severe pain.    [provider]  ipratropium (ATROVENT) 0.06 % nasal spray Place 1 spray into both nostrils as needed. 06/08/22   [provider]  levothyroxine  (SYNTHROID , LEVOTHROID) 75 MCG tablet Take 75 mcg by mouth daily before breakfast.    [provider]  LOKELMA  10 g PACK packet SMARTSIG:1 Packet(s) By Mouth 3 Times a Week    [provider]  losartan  (COZAAR ) 100 MG tablet Take 1 tablet (100 mg total) by mouth daily. 04/17/23 05/17/23  Franklyn Sid SAILOR, MD  Magnesium Gluconate 550 MG TABS Take 30 mg by mouth at bedtime.    [provider]  metoprolol  tartrate (LOPRESSOR ) 25 MG tablet Take 25 mg by mouth 2 (two) times daily.    [provider]  REPATHA SURECLICK 140 MG/ML SOAJ Inject 140 mg into the skin every 14 (fourteen) days. 06/12/22   [provider]  rosuvastatin  (CRESTOR ) 20 MG tablet Take 1 tablet (20 mg total) by mouth daily. 03/27/22   Ghimire, Donalda HERO, MD  salicylic acid  17 % gel Apply topically daily. To wart 03/09/23   Francesca Elsie CROME, MD  Specialty Vitamins Products (ICAPS LUTEIN & ZEAXANTHIN PO) Take 1 tablet by mouth in the morning and at bedtime.    [provider]  triamcinolone acetonide (KENALOG-40) 40 MG/ML injection Inject 40 mg into the articular space once. Given at clinic 07/26/22   [provider]    Allergies: Acrylic polymer [carbomer], Atorvastatin calcium , Ceclor [cefaclor], Cephalosporins, Metronidazole, Morphine and codeine, Other, Penicillins, Septra [sulfamethoxazole-trimethoprim], and Tramadol    Review of  Systems  Updated Vital Signs BP (!) 174/64   Pulse (!) 57   Temp 98 F (36.7 C) (Oral)   Resp 16   SpO2 98%   Physical Exam  (all labs ordered are listed, but only abnormal results are displayed) Labs Reviewed  COMPREHENSIVE METABOLIC PANEL WITH GFR  CBC  URINALYSIS, ROUTINE W REFLEX MICROSCOPIC    EKG: None  Radiology: No results found.  {Document cardiac monitor, telemetry assessment procedure when appropriate:32947} Procedures   Medications Ordered in the ED - No data to display    {Click here for ABCD2, HEART and other calculators REFRESH Note before signing:1}                              Medical Decision Making Amount and/or Complexity of Data Reviewed Labs: ordered.   ***  {Document critical care time when appropriate  Document review of labs and clinical decision tools ie CHADS2VASC2, etc  Document your independent review of radiology images and any outside records  Document your discussion with family members, caretakers and with consultants  Document social determinants of health affecting pt's care  Document your decision making why or why not admission, treatments were needed:32947:::1}   Final diagnoses:  None    ED Discharge Orders     None

## 2024-06-17 NOTE — Progress Notes (Signed)
 Subjective:      Patient ID: Gina Stewart is a 89 y.o. female.  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   She had an echocardiogram during her admission on February 25, 2024 that showed normal biventricular function.  There was a gradient of 4 mmHg across her mitral valve at a heart rate of 60.  Since I last saw her she has felt well.  No complaints today.  Planning on going to her friends for Thanksgiving tomorrow.  Continuing to do all of her daily activities including driving.   The following portions of the patient's history were reviewed and updated as appropriate: allergies, current medications, past family history, past medical history, past social history, past surgical history, and problem list. Review of Systems  All other systems reviewed and are negative.     Objective:    Physical Exam HENT:     Head: Normocephalic.     Mouth/Throat:     Mouth: Mucous membranes are moist.     Pharynx: Oropharynx is clear.  Eyes:     Pupils: Pupils are equal, round, and reactive to light.  Cardiovascular:     Rate and Rhythm: Normal rate and regular rhythm.     Pulses: Normal pulses.     Heart sounds: No murmur heard. Pulmonary:     Effort: Pulmonary effort is normal.     Breath sounds: Normal breath sounds.  Abdominal:     General: Abdomen is flat.     Palpations: Abdomen is soft.  Musculoskeletal:     Right lower leg: No edema.     Left lower leg: No edema.  Skin:    General: Skin is warm and dry.     Findings: No lesion.  Neurological:     General: No focal deficit present.     Mental Status: She is alert and oriented to person, place, and time.  Psychiatric:        Mood and Affect: Mood normal.        Behavior: Behavior normal.     Lab Review:  Lab Results  Component Value Date   NA 140 06/04/2024   NA 141 05/07/2024    NA 138 03/09/2024   K 4.2 06/04/2024   K 4.3 05/07/2024   K 5.1 03/09/2024   CO2 29 06/04/2024   CO2 26 05/07/2024   CO2 28 03/09/2024   BUN 23 06/04/2024   BUN 27 (H) 05/07/2024   BUN 37 (H) 03/09/2024   CREATININE 0.94 06/04/2024   CREATININE 1.07 05/07/2024   CREATININE 1.13 03/09/2024   GLUCOSE 101 (H) 06/04/2024   GLUCOSE 107 (H) 05/07/2024   GLUCOSE 108 (H) 03/09/2024   CALCIUM  9.5 06/04/2024   CALCIUM  9.5 05/07/2024   CALCIUM  9.2 03/09/2024   Lab Results  Component Value Date   CKMB 2.4 08/08/2019   Lab Results  Component Value Date   WBC 8.50 06/04/2024   HGB 11.9 (L) 06/04/2024   HCT 35.2 (L) 06/04/2024   MCV 92.0 06/04/2024   PLT 295 06/04/2024   Lab Results  Component Value Date   CHOL 226 (H) 05/07/2024   TRIG 80 05/07/2024   HDL 74 05/07/2024   LDLDIRECT 81 06/12/2022      Assessment:     No diagnosis found.    Plan:     Gina Stewart a very pleasant 89 year old female with a history  of systolic hypertension, CKD, solitary kidney, peripheral arterial disease who presents for evaluation of resistant hypertension.  Her blood pressure is reasonably controlled on her current regimen. BP is well controlled on current regimen through her PCP's office.  She recently had an admission for symptomatic bradycardia in the setting of coreg use, which has since resolved with stopping here medication.  She will continue on amlodipine -olmesartan for now.  I will see her back in a few months.   Can f/u with me in about 6 months, sooner if needed.

## 2024-07-13 NOTE — Unmapped External Note (Signed)
" °  [facility]  - [facility] [phone] [place] [address] [phone] Fax: [phone]   PHYSICIAN SIGNATURE REQUIRED  Thank you for your referral. Please review the following Plan of Care and sign electronically, or fax signed paper copy to: [phone].   Please call Dept: [phone] with any questions or concerns.  Redell Holly Arenas, PT  Physical Therapy Plan of Care  Recertification Diagnosis:  1. Impaired mobility and activities of daily living      2. Decreased range of motion of right shoulder      3. Chronic right shoulder pain      4. Decreased strength of upper extremity      5. General weakness           Impairment List: Activity tolerance, Joint stiffness, Pain, Gait, Range of motion, Strength, Functional limitations, Mobility, Endurance   Plan and Recommendations: Plan Recommended PT Treatment/Interventions: Gait training (02883); Manual therapy (97140); Electrical stimulation-unattended (02985); Neuromuscular re-education [phone]); Therapeutic activity (97530); Therapeutic exercise (97110); Vasopneumatic compression [phone]); Self-care/home management [phone]); Dry Needling (3+ muscles) (79438); Dry needling (1-2 muscles) (20560) Treatment Plan Details: 1x/week, 12 visits    Goals:  Goals Addressed             This Visit's Progress    PT Goal   On track    1) pt will be independent with HEP and report consistent completion throughout time in therapy.   2) pt will improve TUG to <10 sec in 12 visits  05/1924: 13 sec   07/10/24: 12 seconds performed with FWW  3) pt will be able to perform 30 sec STS >15 reps in 12 visits  07/10/24: improving 14 reps completed 4) pt will improve right shoulder flexion and abduction to >85 deg each in 12 visits  5) pt will improve BERG balance >46 in 12 visits  07/10/24: 45 scored          Certification: This is to certify that the above named  patient, who is under my care, requires skilled Therapy services as described in the above treatment plan. I further certify that the services outlined in this plan are skilled and medically necessary. I have reviewed this plan for rehabilitation services, and I recommend that these services continue to meet the above stated goals and plan.  "

## 2024-07-24 NOTE — Progress Notes (Signed)
 [facility] [address] [place] [address]    Name: Sharisa Toves Renner DOB: Dec 12, 1929   July 24, 2024   Patient ID: Aritzel Krusemark Erdman is a 89 y.o. female is here for Follow-up (F/u Urgent care. Congestion, sore throat, and cough)    HPI: Patient presents for urgent care follow up. Patient reports productive cough, congestion, and sore throat for the past few weeks. She was initially seen in urgent care on 07/04/24 and was treated with azithromycin. She returned to urgent care on 07/22/24 for persistent symptoms. She was started on doxycycline  at that time.   Today patient reports that she has taken doxycycline  for about 2 days but continues to have symptoms. She has noticed scant blood in her sputum and nasal drainage a few times, but there has not been any copious bleeding. Has been using nasal saline but no other OTC medications.  At urgent care visit on 07/22/24 she also reported UTI symptoms but urine culture was negative. No longer having UTI symptoms at this time.  No SOB, fever, chills, nausea, vomiting, body aches, chest pain, or other new symptoms.    Medical History    Allergies Allergies[1]  Medications Current Medications[2]  Medical history Medical History[3]  Surgical History Surgical History[4]  Social History Tobacco Use History[5]  Family History Family History[6]  I have reviewed and (if needed) updated the patient's problem list, medications, allergies, past medical and surgical history, social and family history.   ROS   Per HPI    Physical Exam    Vital Signs  Vitals:   07/24/24 1035  BP: 146/76  BP Location: Left arm  Patient Position: Sitting  Pulse: 75  Resp: 16  SpO2: 99%  Weight: 51.9 kg (114 lb 6.4 oz)  Height: 1.499 m (4' 11.02)     Physical Exam Vitals reviewed.  Constitutional:      General: She is not in acute distress.    Appearance: She is not diaphoretic.  HENT:      Head: Normocephalic and atraumatic.     Right Ear: Tympanic membrane, ear canal and external ear normal. There is no impacted cerumen.     Left Ear: Tympanic membrane, ear canal and external ear normal. There is no impacted cerumen.     Nose: Congestion present.     Comments: Mild inflammation of turbinates    Mouth/Throat:     Mouth: Mucous membranes are moist.     Pharynx: No posterior oropharyngeal erythema.  Cardiovascular:     Rate and Rhythm: Normal rate and regular rhythm.     Heart sounds: Normal heart sounds. No murmur heard.    No friction rub. No gallop.  Pulmonary:     Effort: Pulmonary effort is normal. No respiratory distress.     Breath sounds: No wheezing or rales.     Comments: Mild rhonchi at some areas of lungs Neurological:     Mental Status: She is alert and oriented to person, place, and time. Mental status is at baseline.     Motor: No weakness.     Coordination: Coordination normal.     Gait: Gait normal.  Psychiatric:        Mood and Affect: Mood normal.        Behavior: Behavior normal.        Thought Content: Thought content normal.        Judgment: Judgment normal.       Assessment/Plan:    Ronal Ripper  was seen today for follow-up.  Diagnoses and all orders for this visit:  URI with cough and congestion/Productive cough -Patient presents for URI symptoms for the past few weeks. I reviewed urgent care notes/workup from 07/04/24 and 07/22/24 -Mild rhonchi at some areas of the lungs, so will order chest x-ray to further evaluate -Rapid flu and rapid COVID were negative -Patient has no respiratory distress so I find outpatient treatment safe and appropriate at this time -Will start prednisone taper pack. Recommended she take full course of doxycycline . Rest and drink plenty of fluids -     predniSONE (STERAPRED DS) 10 mg DsPk dose pack; Use As Directed On Package. -     POC Rapid Influenza A & B Antigen -     POCT COVID BD -     XR Chest 2 Views;  Future  Component Ref Range & Units (hover) 11:02 (07/24/24)        Flu A Negative        Flu B Negative         Component Ref Range & Units (hover) 11:03 (07/24/24)         COVID Antigen, POC Negative           Patient verbalizes understanding and in agreement with the above plan. All questions answered.   Medication side effects discussed with patient. Advised patient to call clinic or return for visit if symptoms occur.  Goals of care discussed with patient including med compliance and adequate follow up.  Return if symptoms worsen or fail to improve.     Portions of this note were dictated using DRAGON voice recognition software. Please disregard any errors in transcription. This record has been created using Conservation officer, historic buildings. Errors have been sought and corrected, but may not always be located. Such creation errors do not reflect on the standard of medical care.       [1] Allergies Allergen Reactions   Cefaclor Shortness Of Breath   Cephalexin Shortness Of Breath   Morphine Palpitations    Chest Pressure   Penicillins Shortness Of Breath   Tramadol Palpitations    Chest Pressure   Pitavastatin Calcium  Myalgias   Statins-Hmg-Coa Reductase Inhibitors Myalgias   Amlodipine  Dizziness    Lightheaded at higher doses   Cephalosporins    Opioids - Morphine Analogues    Sulfamethoxazole-Trimethoprim    Carbomer Rash   Ciprofloxacin Rash   Haptens - Rubber Additives Series Rash   Metronidazole Rash   Sulfa (Sulfonamide Antibiotics) Rash  [2] Current Outpatient Medications  Medication Sig Dispense Refill   amlodipine -olmesartan 10-40 mg tab TAKE 1 TABLET BY MOUTH DAILY 90 tablet 1   aspirin  81 mg EC tablet Take 1 tablet (81 mg total) by mouth daily. For 1 month after surgery to help prevent blood clots 30 tablet 1   cholecalciferol (VITAMIN D3) 1,000 unit (25 mcg) tablet Take 1,000 Units by mouth daily.     doxycycline  (VIBRA -TABS)  100 mg tablet Take 1 tablet (100 mg total) by mouth 2 (two) times a day for 7 days. Take with 8 oz water. Do not lie down for at least 30 minutes after. 14 tablet 0   evolocumab (Repatha SureClick) 140 mg/mL pnij Inject 1 mL (140 mg total) under the skin every 14 (fourteen) days. 2 mL 5   gabapentin (NEURONTIN) 100 mg capsule TAKE 1 CAPSULE(100 MG) BY MOUTH TWICE DAILY AS NEEDED FOR NERVE PAIN 30 capsule 1   hydrALAZINE  (APRESOLINE ) 25 mg tablet  Take 0.5 tablets (12.5 mg total) by mouth daily as needed (If blood pressure >160/100). 90 tablet 0   MAGNESIUM ORAL Take 500 mcg by mouth every evening.     meclizine  (ANTIVERT ) 12.5 mg tablet Take 1 tablet (12.5 mg total) by mouth 3 (three) times a day as needed for dizziness. 30 tablet 5   omeprazole (PriLOSEC) 40 mg DR capsule Take 1 capsule (40 mg total) by mouth before breakfast and before evening meal. 180 capsule 1   ondansetron  (ZOFRAN -ODT) 4 mg disintegrating tablet Dissolve 1 tablet (4 mg total) on tongue every 8 (eight) hours as needed for nausea or vomiting. 20 tablet 0   peg 400-propylene glycol, PF, (Systane, PF,) 0.4-0.3 % dpet Administer 1 drop into affected eye(s) 2 (two) times a day.     promethazine-dextromethorphan (PHENERGAN DM) 6.25-15 mg/5 mL syrp syrup Take 5 mL by mouth every 6 (six) hours as needed (acute cough). 118 mL 0   sodium zirconium cyclosilicate  (Lokelma ) 10 g pwpk packet Take 10 g by mouth every Monday, Wednesday and Friday.     Synthroid  50 mcg tablet Take 1 tablet (50 mcg total) by mouth in the morning. 90 tablet 0   vitamins A,C,E-zinc-copper (I-VITE PROTECT) 2,148 mcg-113 mg-45 mg-17.4mg  tab Take 1 tablet by mouth daily. PRESERVISION     predniSONE (STERAPRED DS) 10 mg DsPk dose pack Use As Directed On Package. 21 tablet 0   No current facility-administered medications for this visit.  [3] Past Medical History: Diagnosis Date   Acquired hypothyroidism    Allergic    Alopecia areata    Aneurysm of  splenic artery    Benign essential hypertension    Benign neoplasm of colon    Benign paroxysmal positional vertigo    Calcification of lung    Carotid artery bruit    Carotid artery plaque    Chondrocalcinosis    Chondrocalcinosis of left knee    Chronic rhinosinusitis    Chronic right shoulder pain    Chronic seasonal allergic rhinitis due to pollen    [facility] arthritis    Decreased functional activity tolerance    Dermatochalasis of both upper eyelids    Dry eye syndrome    Dry eye syndrome of both lacrimal glands    Early stage nonexudative age-related macular degeneration of both eyes    Gastroesophageal reflux disease without esophagitis    GERD without esophagitis    Hammertoe of left foot    Hyperkalemia    Hyperlipidemia LDL goal <70    Hyperopia with astigmatism and presbyopia, bilateral    IFG (impaired fasting glucose)    Keratoconjunctivitis sicca of both eyes not specified as Sjogren's    Lumbar degenerative disc disease    Lumbar radiculopathy    Lumbar spondylolysis    Nasal septal perforation    NSAID long-term use    Numbness and tingling of left side of face    Presenting with transient numbness and tingling of the lower   left side of face, now resolved.  Neurology recommending admission for CVA   work-up. -CT head negative -CTA head/neck negative for emergent LVO, 75%   proximal left ICA stenosis and 60% proximal left subclavian artery   stenosis noted -MRI brain pe   Osteoarthritis of spine with radiculopathy, lumbar region    Osteopenia    Perennial allergic rhinitis with seasonal variation    Primary osteoarthritis involving multiple joints    Pseudophakia of both eyes    Pulmonary nodule seen on  imaging study    Renal carcinoma, right (CMD)    Right rotator cuff tear arthropathy    Sensorineural hearing loss (SNHL) of both ears    Shoulder impingement    Stage 3a chronic kidney disease (HCC)    Trochanteric  bursitis of left hip    Vitreous floaters, bilateral   [4] Past Surgical History: Procedure Laterality Date   ADENOIDECTOMY     APPENDECTOMY     CATARACT EXTRACTION W/  INTRAOCULAR LENS IMPLANT Right 1993   CATARACT EXTRACTION W/  INTRAOCULAR LENS IMPLANT; Dr. Geraline   CATARACT EXTRACTION W/  INTRAOCULAR LENS IMPLANT Left 1994   CATARACT EXTRACTION W/  INTRAOCULAR LENS IMPLANT; Dr. Geraline   CHOLECYSTECTOMY     HERNIA REPAIR     HYSTERECTOMY   1971   JOINT REPLACEMENT     NASAL SEPTUM SURGERY (AKA DEVIATED SEPTUM)     NEPHRECTOMY Right    Due To Renal Cell Carcinoma   TONSILLECTOMY     TONSILLECTOMY     TOTAL VAGINAL HYSTERECTOMY    [5] Social History Tobacco Use  Smoking Status Never  Smokeless Tobacco Never  [6] Family History Adopted: Yes  Problem Relation Name Age of Onset   Cataracts Mother Odetta    Heart disease Mother Odetta    Hyperlipidemia Mother Odetta    Hypertension Mother Odetta    Cataracts Father Quintin    Heart disease Father Quintin    Hyperlipidemia Father Quintin    Hypertension Father Raymond    Cataracts Maternal Grandmother     Cataracts Maternal Grandfather     Cataracts Paternal Grandmother     Cataracts Paternal Grandfather     Hypertension Daughter Deane    Diabetes Daughter Deane    Obesity Daughter Betsy    Osteoporosis Daughter [place]    Stroke Daughter [place]    Cancer Son Educational Psychologist        Tongue   Colon cancer Neg Hx     Colon polyps Neg Hx     Glaucoma Neg Hx     Macular degeneration Neg Hx

## 2024-07-27 NOTE — Telephone Encounter (Signed)
 Patient contacted and advised of providers recommendations, patient verbalized understanding.

## 2024-07-27 NOTE — Progress Notes (Signed)
 Duplicate message, please see triage note.

## 2024-07-27 NOTE — Telephone Encounter (Signed)
 I am sending in [facility] for cough. I recommend she continue to monitor BP and come be reevaluated for any new or worsening symptoms, or if BP continues to run high over the next few days

## 2024-07-27 NOTE — Telephone Encounter (Signed)
 Per PACT team, patient seen last week and has x-rays done that had no changes. Patient states she is feeling worse and lightheaded. Patient transferred from [place] with the PACT team. Patient reports nonproductive cough, sinus congestion, weakness, dizziness first thing in the morning and goes away. States has had this on and off for a long time, increased BP of 166/80 P- 85 this morning about 2 hours after taking her BP medication. States she continues to take the antibiotic and has 2 days left of that and the prednisone.   BP now 165/76 P-76  Denies sinus pain, earache, fever, SOB, CP, H/A, redness/swelling to cheeks, forehead, or around the eyes, hx of asthma, COPD or other severe lung disease, passed out or feeling faint, coughed up blood, ankle swelling, wheezing, unsteady gait.   07/24/24 URI with cough and congestion/Productive cough -Patient presents for URI symptoms for the past few weeks. I reviewed urgent care notes/workup from 07/04/24 and 07/22/24 -Mild rhonchi at some areas of the lungs, so will order chest x-ray to further evaluate -Rapid flu and rapid COVID were negative -Patient has no respiratory distress so I find outpatient treatment safe and appropriate at this time -Will start prednisone taper pack. Recommended she take full course of doxycycline . Rest and drink plenty of fluids -     predniSONE (STERAPRED DS) 10 mg DsPk dose pack; Use As Directed On Package. -     POC Rapid Influenza A & B Antigen -     POCT COVID BD -     XR Chest 2 Views; Future  Please advise if needs another appointment? Patient is requesting something to help with her cough.

## 2024-07-28 ENCOUNTER — Emergency Department (HOSPITAL_BASED_OUTPATIENT_CLINIC_OR_DEPARTMENT_OTHER)
Admission: EM | Admit: 2024-07-28 | Discharge: 2024-07-28 | Disposition: A | Discharge status: Home / Self Care | Attending: Emergency Medicine | Admitting: Emergency Medicine

## 2024-07-28 ENCOUNTER — Other Ambulatory Visit: Payer: Self-pay

## 2024-07-28 ENCOUNTER — Encounter (HOSPITAL_BASED_OUTPATIENT_CLINIC_OR_DEPARTMENT_OTHER): Payer: Self-pay | Admitting: Emergency Medicine

## 2024-07-28 ENCOUNTER — Emergency Department (HOSPITAL_BASED_OUTPATIENT_CLINIC_OR_DEPARTMENT_OTHER)

## 2024-07-28 DIAGNOSIS — Z85528 Personal history of other malignant neoplasm of kidney: Secondary | ICD-10-CM | POA: Insufficient documentation

## 2024-07-28 DIAGNOSIS — R0981 Nasal congestion: Secondary | ICD-10-CM | POA: Diagnosis not present

## 2024-07-28 DIAGNOSIS — R0602 Shortness of breath: Secondary | ICD-10-CM | POA: Insufficient documentation

## 2024-07-28 DIAGNOSIS — B974 Respiratory syncytial virus as the cause of diseases classified elsewhere: Secondary | ICD-10-CM | POA: Diagnosis not present

## 2024-07-28 DIAGNOSIS — I10 Essential (primary) hypertension: Secondary | ICD-10-CM | POA: Insufficient documentation

## 2024-07-28 DIAGNOSIS — B338 Other specified viral diseases: Secondary | ICD-10-CM

## 2024-07-28 DIAGNOSIS — R059 Cough, unspecified: Secondary | ICD-10-CM | POA: Insufficient documentation

## 2024-07-28 DIAGNOSIS — Z79899 Other long term (current) drug therapy: Secondary | ICD-10-CM | POA: Diagnosis not present

## 2024-07-28 LAB — BASIC METABOLIC PANEL WITH GFR
Anion gap: 15 (ref 5–15)
BUN: 28 mg/dL — ABNORMAL HIGH (ref 8–23)
CO2: 21 mmol/L — ABNORMAL LOW (ref 22–32)
Calcium: 9.1 mg/dL (ref 8.9–10.3)
Chloride: 102 mmol/L (ref 98–111)
Creatinine, Ser: 0.93 mg/dL (ref 0.44–1.00)
GFR, Estimated: 57 mL/min — ABNORMAL LOW
Glucose, Bld: 155 mg/dL — ABNORMAL HIGH (ref 70–99)
Potassium: 4 mmol/L (ref 3.5–5.1)
Sodium: 138 mmol/L (ref 135–145)

## 2024-07-28 LAB — CBC
HCT: 38.8 % (ref 36.0–46.0)
Hemoglobin: 12.8 g/dL (ref 12.0–15.0)
MCH: 29.6 pg (ref 26.0–34.0)
MCHC: 33 g/dL (ref 30.0–36.0)
MCV: 89.8 fL (ref 80.0–100.0)
Platelets: 334 K/uL (ref 150–400)
RBC: 4.32 MIL/uL (ref 3.87–5.11)
RDW: 13.3 % (ref 11.5–15.5)
WBC: 8.8 K/uL (ref 4.0–10.5)
nRBC: 0 % (ref 0.0–0.2)

## 2024-07-28 LAB — RESP PANEL BY RT-PCR (RSV, FLU A&B, COVID)  RVPGX2
Influenza A by PCR: NEGATIVE
Influenza B by PCR: NEGATIVE
Resp Syncytial Virus by PCR: POSITIVE — AB
SARS Coronavirus 2 by RT PCR: NEGATIVE

## 2024-07-28 MED ORDER — ALBUTEROL SULFATE HFA 108 (90 BASE) MCG/ACT IN AERS: 1.0000 | INHALATION_SPRAY | Freq: Four times a day (QID) | RESPIRATORY_TRACT | 0 refills | Status: AC | PRN

## 2024-07-28 NOTE — ED Provider Notes (Signed)
 " [place] [facility] Provider Note   CSN: [id number] Arrival date & time: 07/28/24  1118     Patient presents with: Cough   Gina Stewart is a 89 y.o. female with a history of hypertension, kidney cancer status post radical nephrectomy 2001 presents with complaints of persistent productive cough.  Patient states that she has had some sinus congestion with this as well.  She denies any chest pain.  Has had some shortness of breath with this with coughing.  Her symptoms are nonexertional.  Denies any orthopnea.  She has no associated chest pain.  Denies any cardiac history or respiratory issues.  She denies using oxygen at home. Was initially seen on 07/04/2024 and covered with course of azithromycin as she felt that her symptoms were related to a sinus infection.  Had temporary relief  of her symptoms returned on 12/31 with recurrent symptoms and treated with a course of prednisone and doxycycline .  Reports compliance with this, however her symptoms continue to persist.    Cough  Past Medical History:  Diagnosis Date   Cancer (HCC)    GERD (gastroesophageal reflux disease)    High cholesterol    Hypertension    Thyroid disease    Past Surgical History:  Procedure Laterality Date   APPENDECTOMY     BACK SURGERY     CATARACT EXTRACTION     CHOLECYSTECTOMY     HERNIA REPAIR     NASAL SEPTUM SURGERY     NEPHRECTOMY Right    TONSILLECTOMY         Prior to Admission medications  Medication Sig Start Date End Date Taking? Authorizing Provider  albuterol  (VENTOLIN  HFA) 108 (90 Base) MCG/ACT inhaler Inhale 1-2 puffs into the lungs every 6 (six) hours as needed for wheezing or shortness of breath. 07/28/24  Yes Donnajean Lynwood DEL, PA-C  amLODipine  (NORVASC ) 10 MG tablet Take 10 mg by mouth daily.    [provider]  Biotin 10 MG TABS Take 10 mg by mouth daily. 06/27/11   [provider]  Cholecalciferol (VITAMIN D-1000 MAX ST) 25  MCG (1000 UT) tablet Take 1,000 Units by mouth daily.    [provider]  clopidogrel  (PLAVIX ) 75 MG tablet Take 1 tablet (75 mg total) by mouth daily. 11/07/22   Freddi Hamilton, MD  famotidine  (PEPCID ) 20 MG tablet Take 20 mg by mouth daily.    [provider]  furosemide  (LASIX ) 20 MG tablet Take 1 tablet (20 mg total) by mouth daily. 04/12/23   Patsey Lot, MD  gabapentin (NEURONTIN) 100 MG capsule Take 1-2 capsules twice daily. 01/10/17   [provider]  HYDROcodone-acetaminophen  (NORCO) 10-325 MG tablet Take 1 tablet by mouth every 6 (six) hours as needed for severe pain.    [provider]  ipratropium (ATROVENT) 0.06 % nasal spray Place 1 spray into both nostrils as needed. 06/08/22   [provider]  levothyroxine  (SYNTHROID , LEVOTHROID) 75 MCG tablet Take 75 mcg by mouth daily before breakfast.    [provider]  LOKELMA  10 g PACK packet SMARTSIG:1 Packet(s) By Mouth 3 Times a Week    [provider]  losartan  (COZAAR ) 100 MG tablet Take 1 tablet (100 mg total) by mouth daily. 04/17/23 05/17/23  Franklyn Sid SAILOR, MD  Magnesium Gluconate 550 MG TABS Take 30 mg by mouth at bedtime.    [provider]  metoprolol  tartrate (LOPRESSOR ) 25 MG tablet Take 25 mg by mouth  2 (two) times daily.    [provider]  REPATHA SURECLICK 140 MG/ML SOAJ Inject 140 mg into the skin every 14 (fourteen) days. 06/12/22   [provider]  rosuvastatin  (CRESTOR ) 20 MG tablet Take 1 tablet (20 mg total) by mouth daily. 03/27/22   Ghimire, Donalda HERO, MD  salicylic acid  17 % gel Apply topically daily. To wart 03/09/23   Francesca Elsie CROME, MD  Specialty Vitamins Products (ICAPS LUTEIN & ZEAXANTHIN PO) Take 1 tablet by mouth in the morning and at bedtime.    [provider]  triamcinolone acetonide (KENALOG-40) 40 MG/ML injection Inject 40 mg into the articular space once. Given at clinic 07/26/22   [provider]    Allergies: Acrylic polymer [carbomer], Atorvastatin calcium , Ceclor [cefaclor], Cephalosporins, Metronidazole, Morphine and codeine, Other, Penicillins, Septra [sulfamethoxazole-trimethoprim], and Tramadol    Review of Systems  Respiratory:  Positive for cough.     Updated Vital Signs BP (!) 174/73   Pulse 84   Temp 98.2 F (36.8 C) (Oral)   Resp 18   Ht 4' 11 (1.499 m)   Wt 51.3 kg   SpO2 99%   BMI 22.82 kg/m   Physical Exam Vitals and nursing note reviewed.  Constitutional:      General: She is not in acute distress.    Appearance: She is well-developed.  HENT:     Head: Normocephalic and atraumatic.  Eyes:     Conjunctiva/sclera: Conjunctivae normal.  Cardiovascular:     Rate and Rhythm: Normal rate and regular rhythm.     Heart sounds: No murmur heard. Pulmonary:     Effort: Pulmonary effort is normal. No respiratory distress.     Breath sounds: Normal breath sounds.     Comments: Coughs throughout exam Abdominal:     Palpations: Abdomen is soft.     Tenderness: There is no abdominal tenderness.  Musculoskeletal:        General: No swelling.     Cervical back: Neck supple.  Skin:    General: Skin is warm and dry.     Capillary Refill: Capillary refill takes less than 2 seconds.  Neurological:     Mental Status: She is alert.  Psychiatric:        Mood and Affect: Mood normal.     (all labs ordered are listed, but only abnormal results are displayed) Labs Reviewed  RESP PANEL BY RT-PCR (RSV, FLU A&B, COVID)  RVPGX2 - Abnormal; Notable for the following components:      Result Value   Resp Syncytial Virus by PCR POSITIVE (*)    All other components within normal limits  BASIC METABOLIC PANEL WITH GFR - Abnormal; Notable for the following components:   CO2 21 (*)    Glucose, Bld 155 (*)    BUN 28 (*)    GFR, Estimated 57 (*)    All other components within normal limits  CBC    EKG: EKG Interpretation Date/Time:  Tuesday July 28 2024  11:33:56 EST Ventricular Rate:  92 PR Interval:  99 QRS Duration:  78 QT Interval:  364 QTC Calculation: 451 R Axis:   63  Text Interpretation: Sinus rhythm Short PR interval Consider left ventricular hypertrophy Anterior Q waves, possibly due to LVH Confirmed by Jerrol Agent ([id number]) on 07/28/2024 12:18:34 PM  Radiology: DG Chest 2 View Result Date: 07/28/2024 CLINICAL DATA:  Cough and shortness of breath. EXAM: CHEST - 2 VIEW COMPARISON:  February 25, 2024 FINDINGS: The heart  size and mediastinal contours are within normal limits. There is marked severity calcification of the thoracic aorta. The lungs are hyperinflated. Both lungs are clear. Radiopaque surgical clips are seen within the right upper quadrant. Multilevel degenerative changes are noted throughout the thoracic spine. IMPRESSION: No active cardiopulmonary disease. Electronically Signed   By: Suzen Dials M.D.   On: 07/28/2024 12:23     Procedures   Medications Ordered in the ED - No data to display  Clinical Course as of 07/28/24 1447  Tue Jul 28, 2024  1408 CBC No leukocytosis, hemoglobin stable [JT]  1408 Basic metabolic panel(!) No significant abnormality [JT]  1409 DG Chest 2 View No cardiopulmonary disease [JT]  1409 EKG 12-Lead Sinus rhythm [JT]  1417 Respiratory Syncytial Virus by PCR(!): POSITIVE [JL]  1424 Patient evaluated for intermittent URI symptoms over the past few weeks and associated with some shortness of breath while coughing.  Upon arrival patient is hypertensive, otherwise hemodynamically stable.  She is not hypoxic or tachypneic.  Her lung sounds are clear.  Has no leukocytosis and has a clear x-ray.  Respiratory panel is positive for RSV.  Will ambulate with pulse ox and reassess. [JT]  1444 Successful ambulation test. On reassessment patient does have faint rhonchi that clears with cough.  Will send an albuterol  inhaler to her pharmacy.  She is nearly done her course of prednisone and doxycycline .   Encourage close PCP follow-up.  Strict return precautions provided.  Patient cleared for discharge [JT]    Clinical Course User Index [JL] Jerrol Agent, MD [JT] Donnajean Agent DEL, PA-C                                 Medical Decision Making Amount and/or Complexity of Data Reviewed Labs: ordered. Radiology: ordered.   This patient presents to the ED with chief complaint(s) of cough.  The complaint involves an extensive differential diagnosis and also carries with it a high risk of complications and morbidity.   Pertinent past medical history as listed in HPI  The differential diagnosis includes  Low suspicion for pneumonia as patient's lung sounds are clear, has no leukocytosis, is afebrile and no consolidation on x-ray.  Is no chest pain, EKG without ischemic changes.  Do not suspect ACS.  Low suspicion for PE based off exam and history.  Additional history obtained: Additional history obtained from family Records reviewed Care Everywhere/External Records  Disposition:   Patient will be discharged home. The patient has been appropriately medically screened and/or stabilized in the ED. I have low suspicion for any other emergent medical condition which would require further screening, evaluation or treatment in the ED or require inpatient management. At time of discharge the patient is hemodynamically stable and in no acute distress. I have discussed work-up results and diagnosis with patient and answered all questions. Patient is agreeable with discharge plan. We discussed strict return precautions for returning to the emergency department and they verbalized understanding.     Social Determinants of Health:   none  This note was dictated with voice recognition software.  Despite best efforts at proofreading, errors may have occurred which can change the documentation meaning.       Final diagnoses:  Infection due to respiratory syncytial virus (RSV), unspecified infection  type    ED Discharge Orders          Ordered    albuterol  (VENTOLIN  HFA) 108 (90 Base)  MCG/ACT inhaler  Every 6 hours PRN        07/28/24 1446               Donnajean Lynwood VEAR DEVONNA 07/28/24 1447    Jerrol Lynwood, MD 07/28/24 1554  "

## 2024-07-28 NOTE — ED Notes (Signed)
 Pt ambulated around entire unit without difficulty. SP02 97-100% and HR 102-111. EDP aware.

## 2024-07-28 NOTE — Telephone Encounter (Signed)
 Contacted patient, reports is feeling worse today with cough and SOB. States coughing up orange colored phlegm. Congestion, SOB with cough. States she is taking the tessalon pearls but not helping. Increased weakness, fatigue,   Denies fever, too weak to stand, sinus pain, earache, confusion, difficult to awaken, passed out or feeling faint, wheezing, CP, SOB except for when coughing, difficulty swallowing.   Per protocol patient should be seen today. Offered to make appointment with CC. Patient declines and states will go to [facility] ED. States a friend is coming to pick her up to take her and verbalized understanding.

## 2024-07-28 NOTE — ED Notes (Signed)
 Discharge instructions reviewed with patient. Patient verbalizes understanding, no further questions at this time. Medications/prescriptions and follow up information provided. No acute distress noted at time of departure.

## 2024-07-28 NOTE — ED Notes (Addendum)
 Charted oxygen in error. Patient on RA

## 2024-07-28 NOTE — Discharge Instructions (Addendum)
 You were evaluated in the emergency room for cough with shortness of breath.  You are found to have RSV.  This is a virus that is expected to resolve.  You may use the albuterol  inhaler that has been sent to your pharmacy as needed.  Please follow-up with your primary care doctor if your symptoms persist.  If you experience any new or worsening symptoms please return to the emergency room.

## 2024-07-28 NOTE — ED Triage Notes (Signed)
 PT c/o ongoing cough x 1 week. Reports recent sinus infection. Prescribed doxycycline , prednisone, tessalon pearls.  Reports compliance with Rx.  C/o worse cough today, dyspnea on exertion.   Denies fever, swelling.  Good po intake.
